# Patient Record
Sex: Female | Born: 1960 | Race: White | Hispanic: No | Smoking: Current every day smoker
Health system: Southern US, Community
[De-identification: ages and names within clinical notes are randomized; demographics above are authoritative.]

## PROBLEM LIST (undated history)

## (undated) DIAGNOSIS — Z720 Tobacco use: Secondary | ICD-10-CM

## (undated) DIAGNOSIS — M79603 Pain in arm, unspecified: Secondary | ICD-10-CM

## (undated) DIAGNOSIS — G8929 Other chronic pain: Secondary | ICD-10-CM

## (undated) DIAGNOSIS — M8580 Other specified disorders of bone density and structure, unspecified site: Secondary | ICD-10-CM

## (undated) HISTORY — PX: TONSILLECTOMY: SUR1361

## (undated) HISTORY — PX: BREAST REDUCTION SURGERY: SHX8

## (undated) HISTORY — PX: NECK SURGERY: SHX720

## (undated) HISTORY — PX: ABDOMINAL HYSTERECTOMY: SHX81

## (undated) HISTORY — DX: Tobacco use: Z72.0

## (undated) HISTORY — PX: OTHER SURGICAL HISTORY: SHX169

## (undated) HISTORY — DX: Other specified disorders of bone density and structure, unspecified site: M85.80

---

## 2006-05-25 ENCOUNTER — Encounter: Admission: RE | Admit: 2006-05-25 | Discharge: 2006-05-25 | Payer: Self-pay | Admitting: Neurosurgery

## 2006-06-09 ENCOUNTER — Inpatient Hospital Stay (HOSPITAL_COMMUNITY): Admission: RE | Admit: 2006-06-09 | Discharge: 2006-06-10 | Payer: Self-pay | Admitting: Neurosurgery

## 2006-09-05 ENCOUNTER — Ambulatory Visit (HOSPITAL_COMMUNITY): Admission: RE | Admit: 2006-09-05 | Discharge: 2006-09-05 | Payer: Self-pay | Admitting: Neurosurgery

## 2007-01-02 ENCOUNTER — Inpatient Hospital Stay (HOSPITAL_COMMUNITY): Admission: RE | Admit: 2007-01-02 | Discharge: 2007-01-05 | Payer: Self-pay | Admitting: Neurosurgery

## 2007-06-18 ENCOUNTER — Encounter: Admission: RE | Admit: 2007-06-18 | Discharge: 2007-06-18 | Payer: Self-pay | Admitting: Neurosurgery

## 2007-10-03 ENCOUNTER — Inpatient Hospital Stay (HOSPITAL_COMMUNITY): Admission: RE | Admit: 2007-10-03 | Discharge: 2007-10-05 | Payer: Self-pay | Admitting: Neurosurgery

## 2008-03-06 IMAGING — CT CT L SPINE W/ CM
3 of 8 series · 15 of 33 positions shown, 18 images · IV contrast (omnipaque)
Comparison: none

CLINICAL DATA: Back pain and lower extremity pain. 
 LUMBAR MYELOGRAM:
TECHNIQUE: The low back was prepped and draped in a sterile fashion. Lidocaine was utilized for local anesthesia.  Under fluoroscopic guidance, a 22 gauge spinal needle was inserted into the CSF space at L3-4 via left paramedian approach.  20 cc Omnipaque 180 was administered.  No complications were encountered.
TECHNIQUE: Multidetector CT imaging of the lumbar spine was performed after intrathecal injection of contrast.  Multiplanar CT image reconstructions were also generated.

[Series 4: recon 3: l-spine helical · axial · 0.27mm/px · z∈[-100,+38]mm · 7 of 149 slices shown, 9 images]
[im 19/149  soft-tissue]
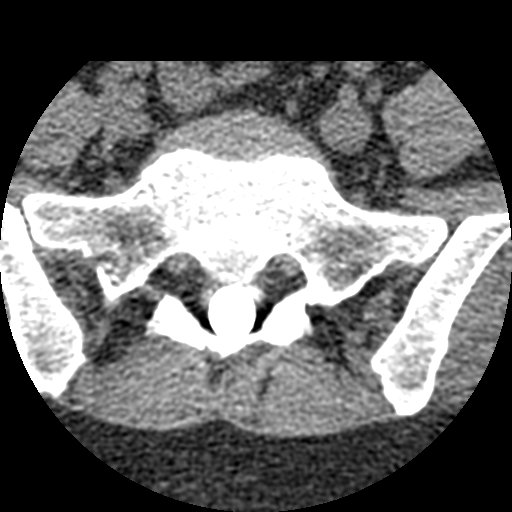
[im 19/149  bone]
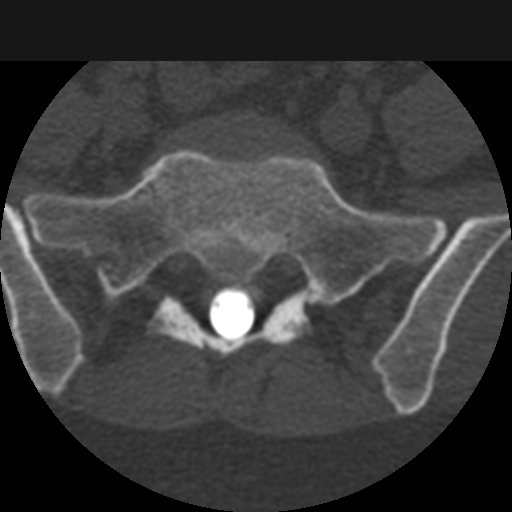
[im 38/149  bone]
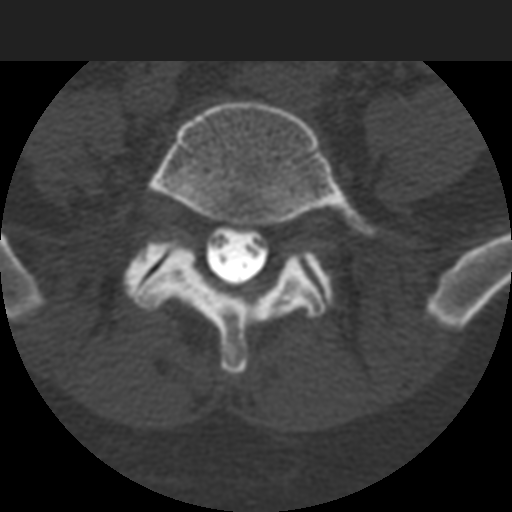
[im 56/149  bone]
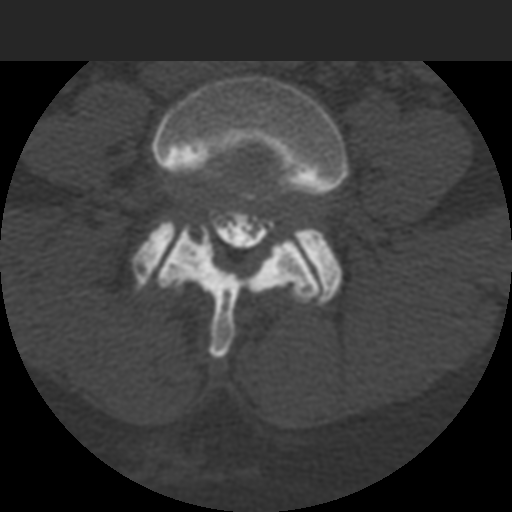
[im 75/149  bone]
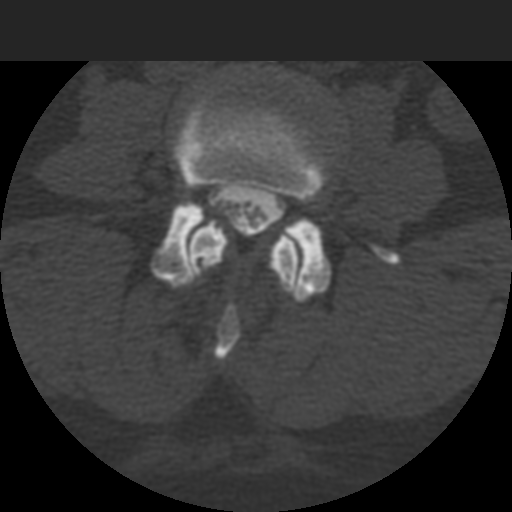
[im 93/149  soft-tissue]
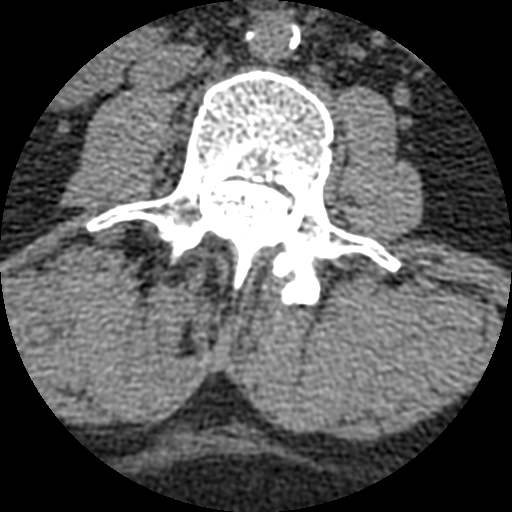
[im 93/149  bone]
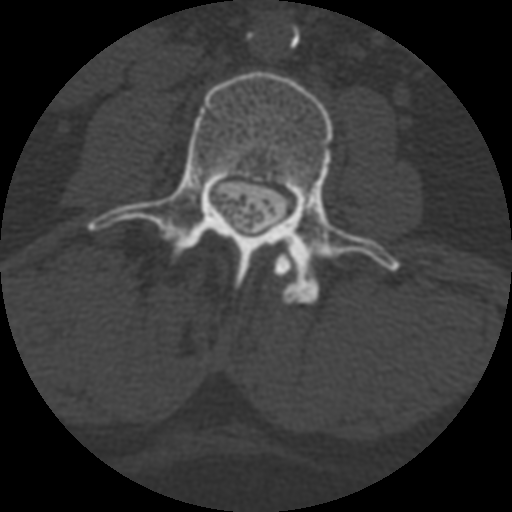
[im 112/149  bone]
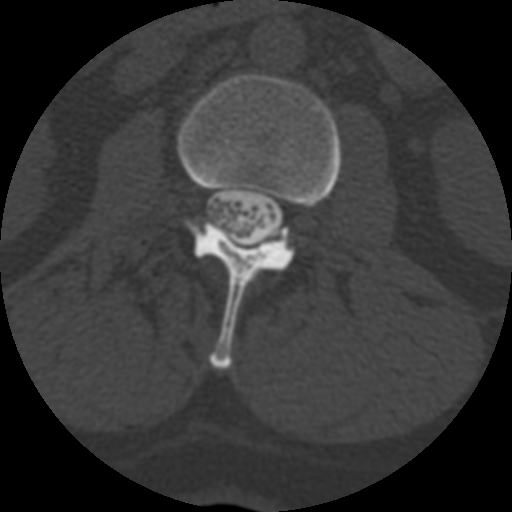
[im 130/149  bone]
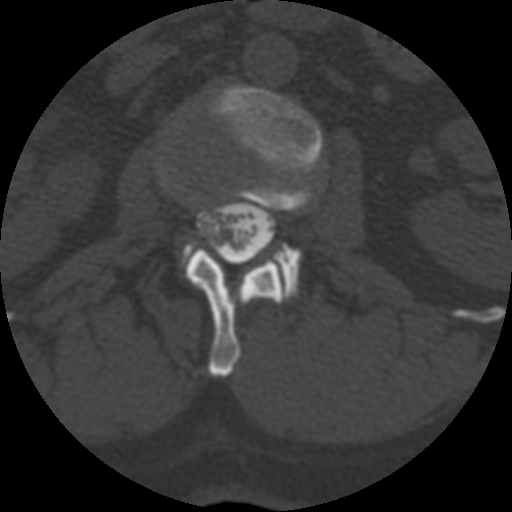

[Series 400: reformatted · sagittal · 0.37mm/px · 5 of 40 slices shown, 6 images (1 of 2)]
[im 14/40  bone]
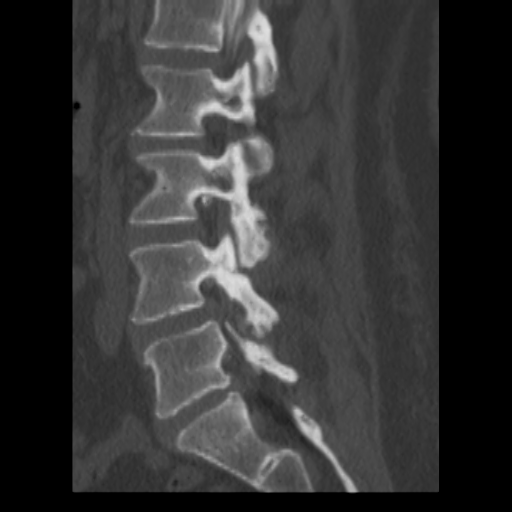
[im 17/40  bone]
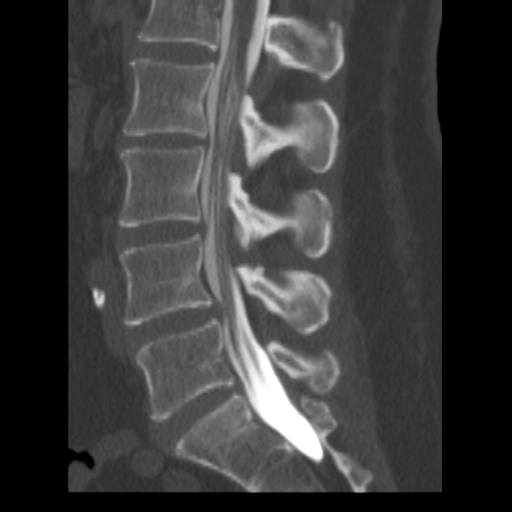
[im 20/40  soft-tissue]
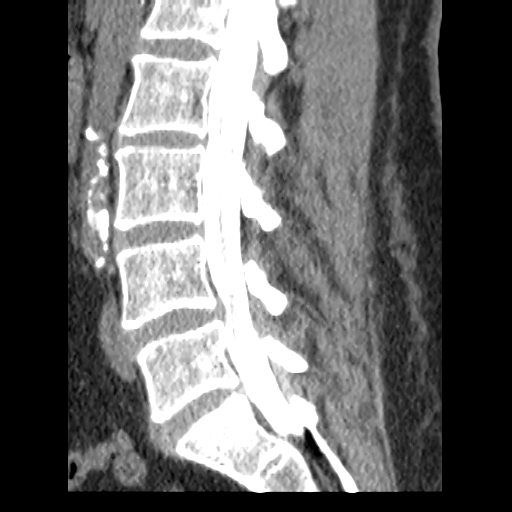
[im 20/40  bone]
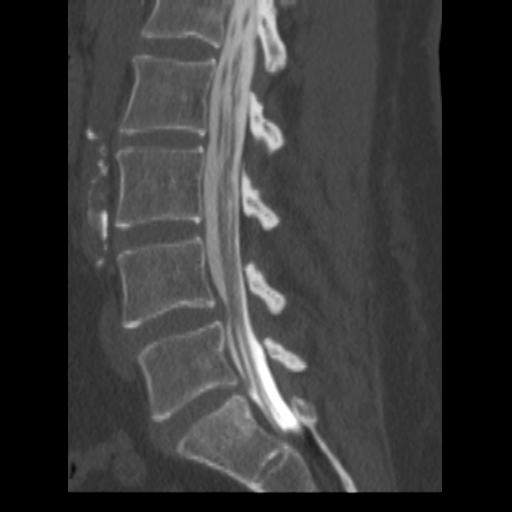
[im 23/40  bone]
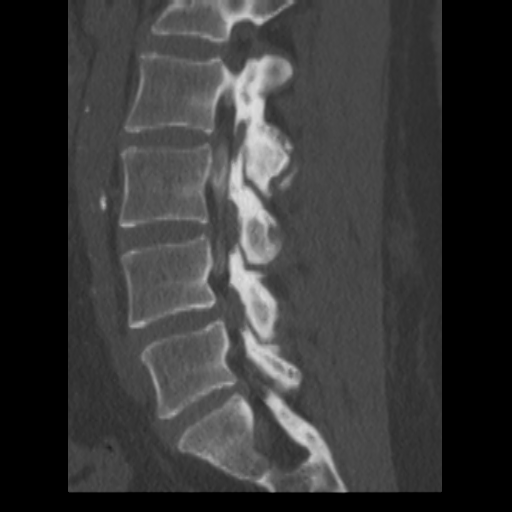
[im 27/40  bone]
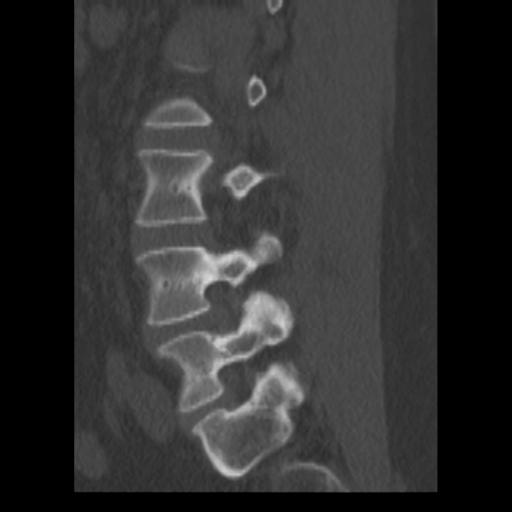

[Series 401: reformatted · coronal · 0.37mm/px · 3 of 40 slices shown (2 of 2)]
[im 8/40  bone]
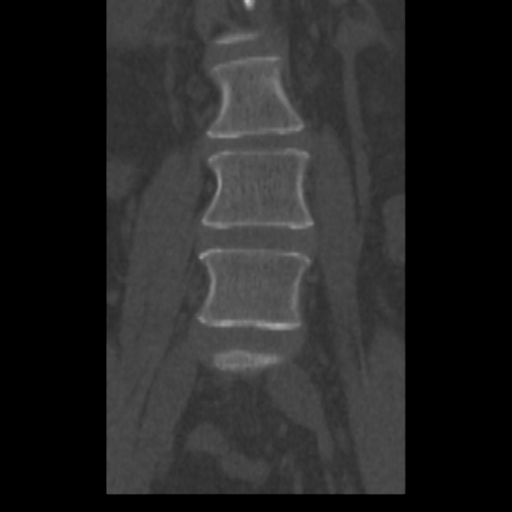
[im 16/40  bone]
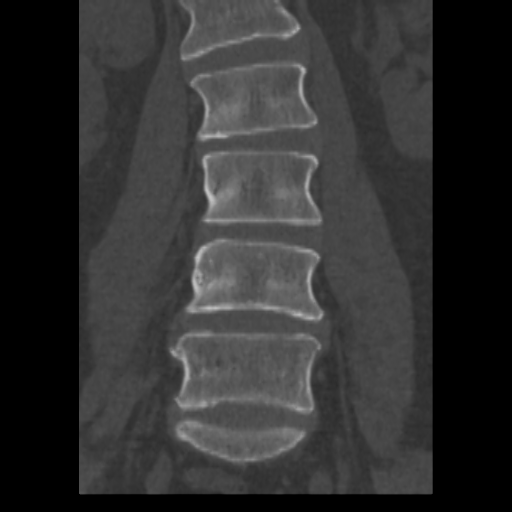
[im 24/40  bone]
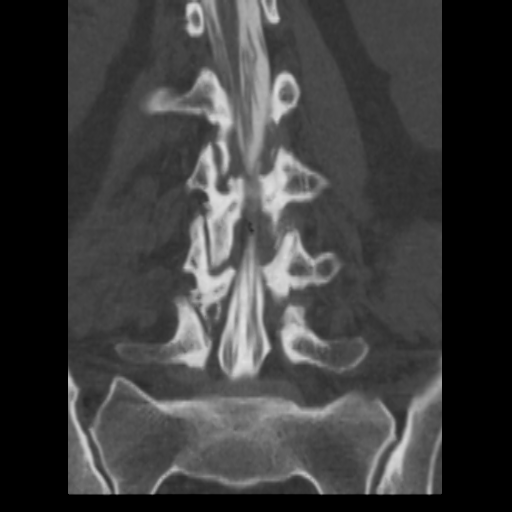

[15 of 33 positions shown; findings below may reference images not displayed]

FINDINGS: Marked dextroscoliosis of the mid thoracic spine is present.  Little if any curvature in the lumbar spine is present.  Moderate anterior epidural mass effect at L4-5 is present with an element of spinal stenosis.  There is some mass effect upon the L5 nerve roots, left greater than right.
IMPRESSION: Thoracic dextroscoliosis.  Spinal stenosis at L4-5.  CT to follow. 
 POST MYELOGRAM CT SCAN OF THE LUMBAR SPINE:
FINDINGS: The conus medullaris terminates at upper L2.  There is no vertebral body height loss.  Mild anterolisthesis at L4-5 is present.  
 L1-2:  Unremarkable.
 L2-3:  Unremarkable.
 L3-4:  Mild facet arthropathy and ligamentum flavum overgrowth, but no significant disk herniation or stenosis.
 L4-5:  Moderate concentric bulge asymmetric to the left causing moderate central stenosis and bilateral lateral recess stenosis, left greater than right.  Facet arthropathy and ligamentum flavum hypertrophy contributes.  Multifactorial moderate biforaminal narrowing is present, left greater than right.  
 L5-S1:  Mild concentric bulge without central stenosis.  Moderate right foraminal stenosis is present and multifactorial.  The left foramen is patent.
IMPRESSION: 1.  Moderate multifactorial central stenosis and bilateral lateral recess stenosis, left greater than right at L4-5. 
 2.  Multifactorial biforaminal stenosis at L4-5 and right foraminal stenosis at L5-S1.   The right L5-S1 foramen is most significantly affected. 
 3.  Multilevel facet arthropathy as described.

## 2008-03-06 IMAGING — RF DG MYELOGRAM LUMBAR
14 of 17 series · 14 of 17 positions shown · IV contrast (omnipaque)
Comparison: none

CLINICAL DATA: Back pain and lower extremity pain. 
 LUMBAR MYELOGRAM:
TECHNIQUE: The low back was prepped and draped in a sterile fashion. Lidocaine was utilized for local anesthesia.  Under fluoroscopic guidance, a 22 gauge spinal needle was inserted into the CSF space at L3-4 via left paramedian approach.  20 cc Omnipaque 180 was administered.  No complications were encountered.
TECHNIQUE: Multidetector CT imaging of the lumbar spine was performed after intrathecal injection of contrast.  Multiplanar CT image reconstructions were also generated.

[Series 1: (hospital) · 1 of 1 slices shown]
[im 1/1]
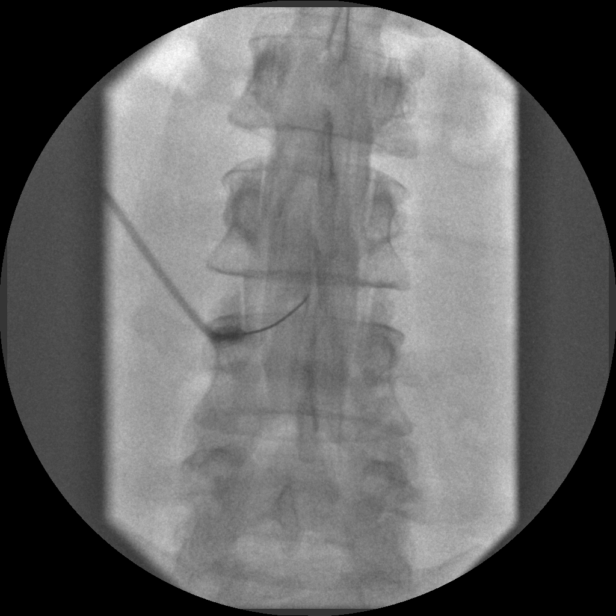

[Series 2: myelogram  white · 1 of 1 slices shown (1 of 13)]
[im 1/1]
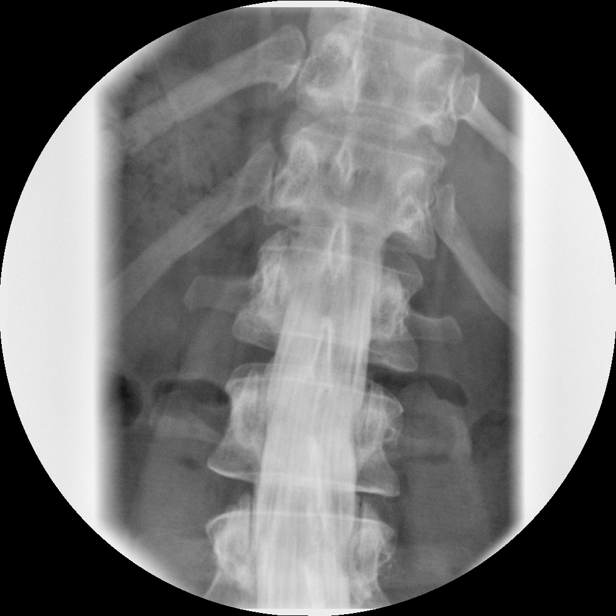

[Series 4: myelogram  white · 1 of 1 slices shown (2 of 13)]
[im 1/1]
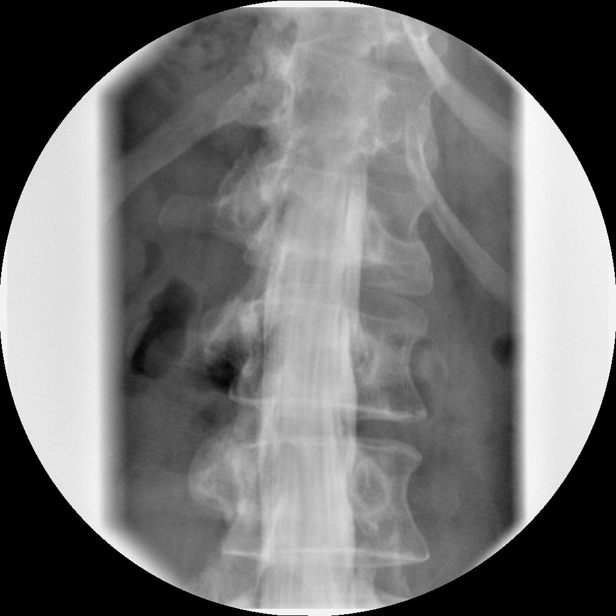

[Series 5: myelogram  white · 1 of 1 slices shown (3 of 13)]
[im 1/1]
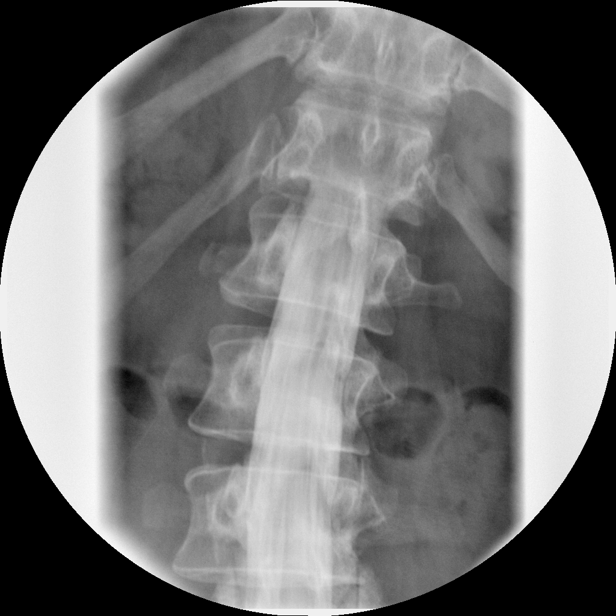

[Series 6: myelogram  white · 1 of 1 slices shown (4 of 13)]
[im 1/1]
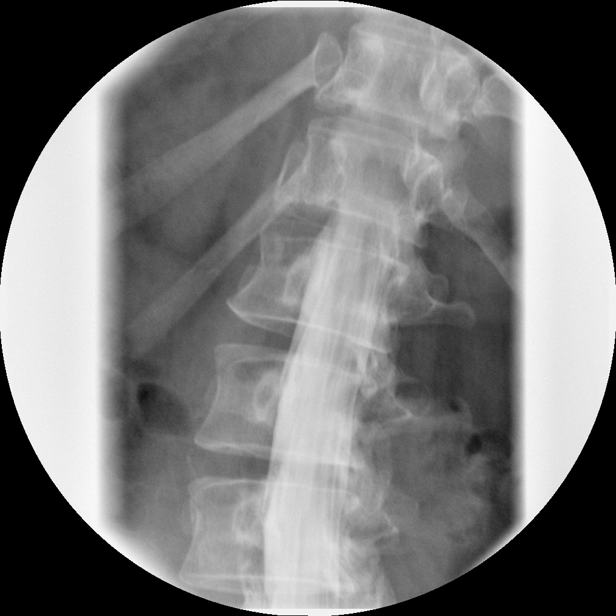

[Series 7: myelogram  white · 1 of 1 slices shown (5 of 13)]
[im 1/1]
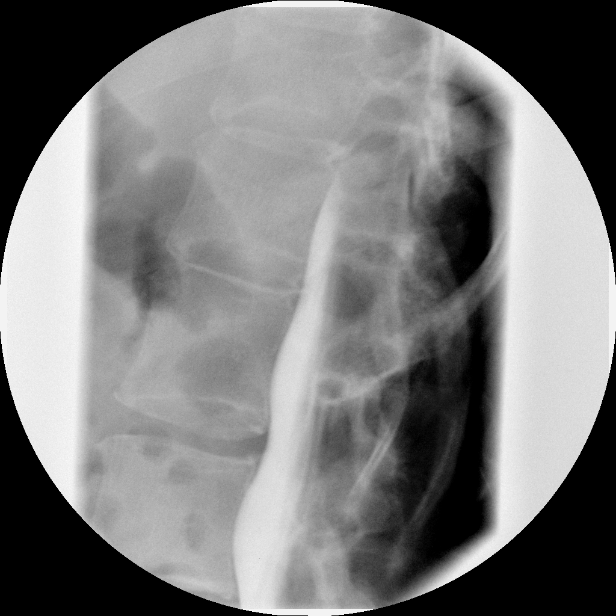

[Series 8: myelogram  white · 1 of 1 slices shown (6 of 13)]
[im 1/1]
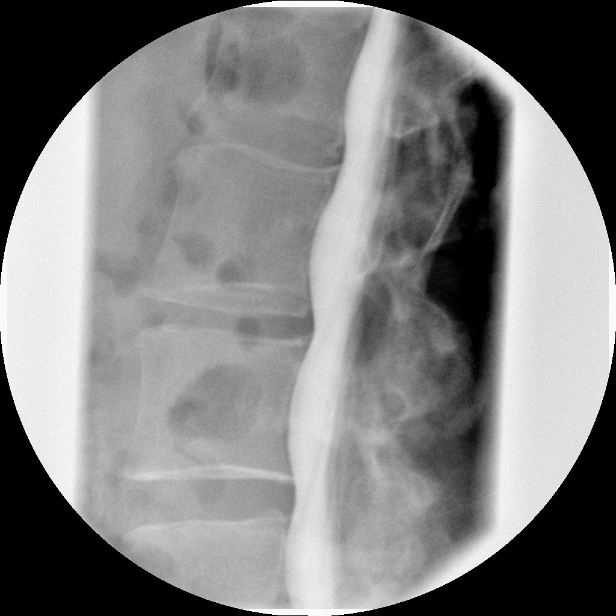

[Series 10: myelogram  white · 1 of 1 slices shown (7 of 13)]
[im 1/1]
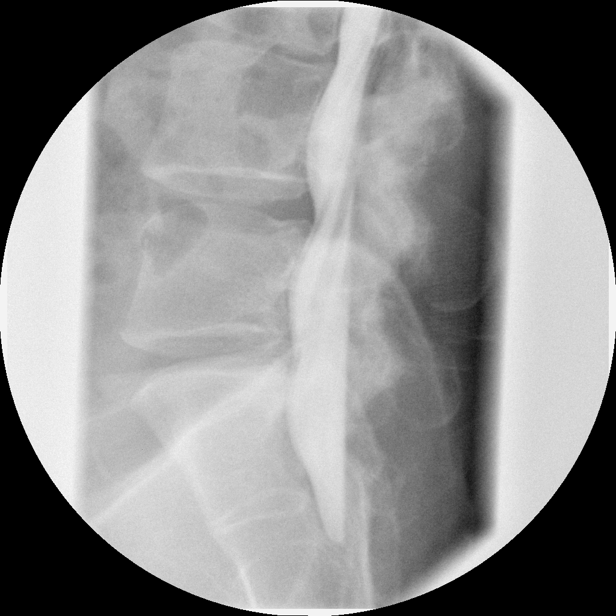

[Series 11: myelogram  white · 1 of 1 slices shown (8 of 13)]
[im 1/1]
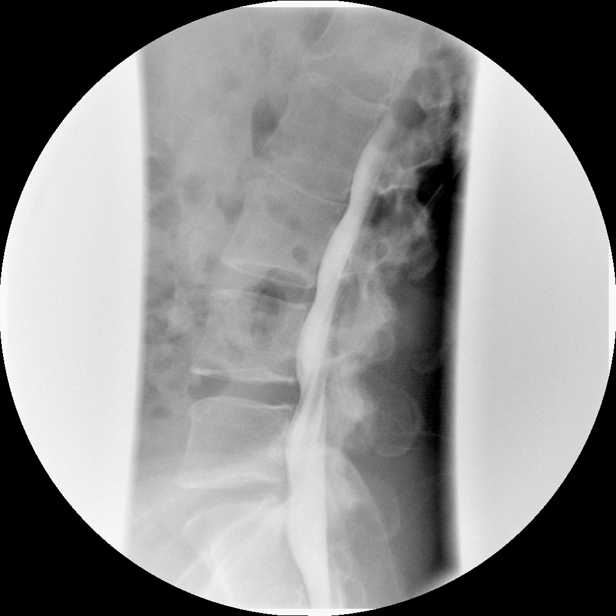

[Series 12: myelogram  white · 1 of 1 slices shown (9 of 13)]
[im 1/1]
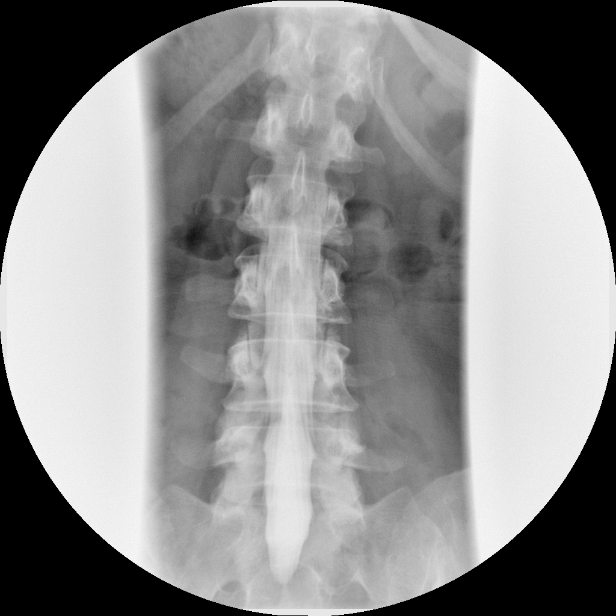

[Series 13: myelogram  white · 1 of 1 slices shown (10 of 13)]
[im 1/1]
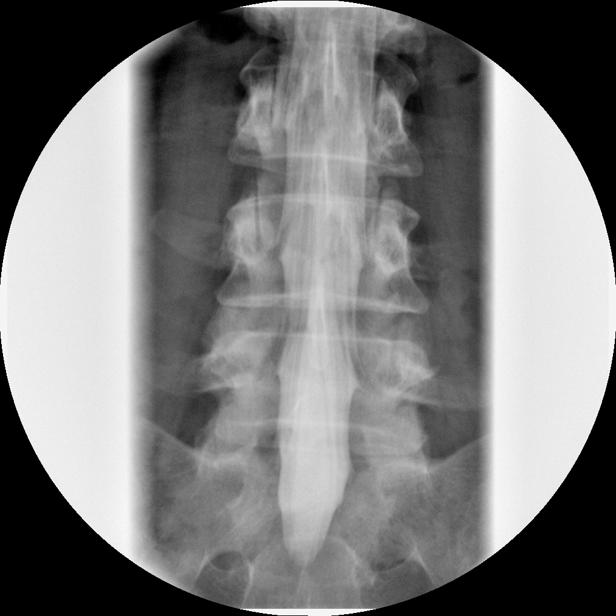

[Series 14: myelogram  white · 1 of 1 slices shown (11 of 13)]
[im 1/1]
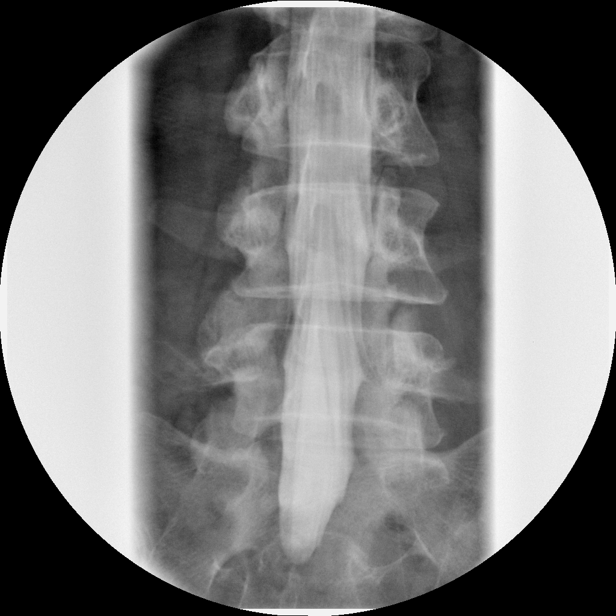

[Series 16: myelogram  white · 1 of 1 slices shown (12 of 13)]
[im 1/1]
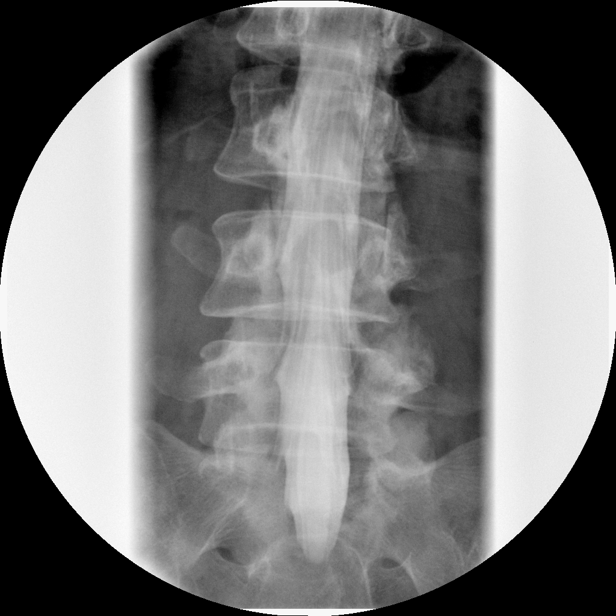

[Series 17: myelogram  white · 1 of 1 slices shown (13 of 13)]
[im 1/1]
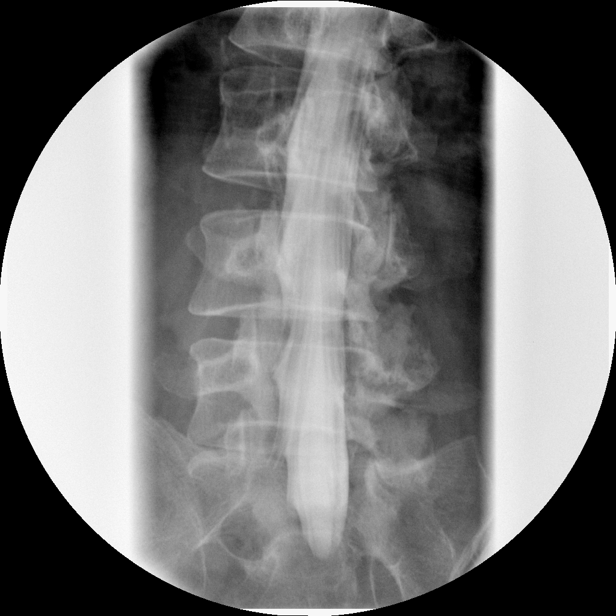

[14 of 17 positions shown; findings below may reference images not displayed]

FINDINGS: Marked dextroscoliosis of the mid thoracic spine is present.  Little if any curvature in the lumbar spine is present.  Moderate anterior epidural mass effect at L4-5 is present with an element of spinal stenosis.  There is some mass effect upon the L5 nerve roots, left greater than right.
IMPRESSION: Thoracic dextroscoliosis.  Spinal stenosis at L4-5.  CT to follow. 
 POST MYELOGRAM CT SCAN OF THE LUMBAR SPINE:
FINDINGS: The conus medullaris terminates at upper L2.  There is no vertebral body height loss.  Mild anterolisthesis at L4-5 is present.  
 L1-2:  Unremarkable.
 L2-3:  Unremarkable.
 L3-4:  Mild facet arthropathy and ligamentum flavum overgrowth, but no significant disk herniation or stenosis.
 L4-5:  Moderate concentric bulge asymmetric to the left causing moderate central stenosis and bilateral lateral recess stenosis, left greater than right.  Facet arthropathy and ligamentum flavum hypertrophy contributes.  Multifactorial moderate biforaminal narrowing is present, left greater than right.  
 L5-S1:  Mild concentric bulge without central stenosis.  Moderate right foraminal stenosis is present and multifactorial.  The left foramen is patent.
IMPRESSION: 1.  Moderate multifactorial central stenosis and bilateral lateral recess stenosis, left greater than right at L4-5. 
 2.  Multifactorial biforaminal stenosis at L4-5 and right foraminal stenosis at L5-S1.   The right L5-S1 foramen is most significantly affected. 
 3.  Multilevel facet arthropathy as described.

## 2010-08-05 ENCOUNTER — Encounter: Admission: RE | Admit: 2010-08-05 | Discharge: 2010-08-05 | Payer: Self-pay | Admitting: Neurosurgery

## 2010-11-20 ENCOUNTER — Encounter: Payer: Self-pay | Admitting: Neurosurgery

## 2010-11-21 ENCOUNTER — Encounter: Payer: Self-pay | Admitting: Neurosurgery

## 2010-11-22 ENCOUNTER — Encounter: Payer: Self-pay | Admitting: Neurosurgery

## 2011-02-23 ENCOUNTER — Encounter (HOSPITAL_COMMUNITY)
Admission: RE | Admit: 2011-02-23 | Discharge: 2011-02-23 | Disposition: A | Discharge status: Home / Self Care | Payer: Medicare Other | Source: Ambulatory Visit | Attending: Neurosurgery | Admitting: Neurosurgery

## 2011-02-23 LAB — CBC
HCT: 40.8 % (ref 36.0–46.0)
Hemoglobin: 13.7 g/dL (ref 12.0–15.0)
MCHC: 33.6 g/dL (ref 30.0–36.0)
MCV: 97.1 fL (ref 78.0–100.0)
Platelets: 301 10*3/uL (ref 150–400)
RBC: 4.2 MIL/uL (ref 3.87–5.11)
RDW: 13.3 % (ref 11.5–15.5)
WBC: 8.8 10*3/uL (ref 4.0–10.5)

## 2011-02-23 LAB — TYPE AND SCREEN
ABO/RH(D): A POS
Antibody Screen: NEGATIVE

## 2011-02-23 LAB — BASIC METABOLIC PANEL
BUN: 11 mg/dL (ref 6–23)
CO2: 27 mEq/L (ref 19–32)
Calcium: 9.2 mg/dL (ref 8.4–10.5)
Chloride: 106 mEq/L (ref 96–112)
Creatinine, Ser: 0.88 mg/dL (ref 0.4–1.2)
GFR calc Af Amer: >60 mL/min (ref >60)
GFR calc non Af Amer: >60 mL/min (ref >60)
Glucose, Bld: 86 mg/dL (ref 70–99)
Potassium: 4.6 mEq/L (ref 3.5–5.1)
Sodium: 137 mEq/L (ref 135–145)

## 2011-02-23 LAB — SURGICAL PCR SCREEN
MRSA, PCR: NEGATIVE
Staphylococcus aureus: POSITIVE — AB

## 2011-02-25 ENCOUNTER — Inpatient Hospital Stay (HOSPITAL_COMMUNITY): Payer: Medicare Other

## 2011-02-25 ENCOUNTER — Inpatient Hospital Stay (HOSPITAL_COMMUNITY)
Admission: RE | Admit: 2011-02-25 | Discharge: 2011-03-04 | DRG: 459 | Disposition: A | Discharge status: Home Health | Payer: Medicare Other | Source: Ambulatory Visit | Attending: Neurosurgery | Admitting: Neurosurgery

## 2011-02-25 DIAGNOSIS — F17200 Nicotine dependence, unspecified, uncomplicated: Secondary | ICD-10-CM | POA: Diagnosis present

## 2011-02-25 DIAGNOSIS — E669 Obesity, unspecified: Secondary | ICD-10-CM | POA: Diagnosis present

## 2011-02-25 DIAGNOSIS — J189 Pneumonia, unspecified organism: Secondary | ICD-10-CM | POA: Diagnosis not present

## 2011-02-25 DIAGNOSIS — E876 Hypokalemia: Secondary | ICD-10-CM | POA: Diagnosis not present

## 2011-02-25 DIAGNOSIS — R0902 Hypoxemia: Secondary | ICD-10-CM | POA: Diagnosis not present

## 2011-02-25 DIAGNOSIS — F0390 Unspecified dementia without behavioral disturbance: Secondary | ICD-10-CM | POA: Diagnosis present

## 2011-02-25 DIAGNOSIS — J449 Chronic obstructive pulmonary disease, unspecified: Secondary | ICD-10-CM | POA: Diagnosis present

## 2011-02-25 DIAGNOSIS — J4489 Other specified chronic obstructive pulmonary disease: Secondary | ICD-10-CM | POA: Diagnosis present

## 2011-02-25 DIAGNOSIS — Z01812 Encounter for preprocedural laboratory examination: Secondary | ICD-10-CM

## 2011-02-25 DIAGNOSIS — Z472 Encounter for removal of internal fixation device: Secondary | ICD-10-CM

## 2011-02-25 DIAGNOSIS — M899 Disorder of bone, unspecified: Secondary | ICD-10-CM | POA: Diagnosis present

## 2011-02-25 DIAGNOSIS — Z981 Arthrodesis status: Secondary | ICD-10-CM

## 2011-02-25 DIAGNOSIS — I959 Hypotension, unspecified: Secondary | ICD-10-CM | POA: Diagnosis not present

## 2011-02-25 DIAGNOSIS — Q762 Congenital spondylolisthesis: Principal | ICD-10-CM

## 2011-02-25 DIAGNOSIS — D649 Anemia, unspecified: Secondary | ICD-10-CM | POA: Diagnosis not present

## 2011-02-25 DIAGNOSIS — N318 Other neuromuscular dysfunction of bladder: Secondary | ICD-10-CM | POA: Diagnosis present

## 2011-02-25 DIAGNOSIS — Z79899 Other long term (current) drug therapy: Secondary | ICD-10-CM

## 2011-02-26 ENCOUNTER — Inpatient Hospital Stay (HOSPITAL_COMMUNITY): Payer: Medicare Other

## 2011-02-26 DIAGNOSIS — J0100 Acute maxillary sinusitis, unspecified: Secondary | ICD-10-CM

## 2011-02-26 DIAGNOSIS — J449 Chronic obstructive pulmonary disease, unspecified: Secondary | ICD-10-CM

## 2011-02-26 DIAGNOSIS — R0902 Hypoxemia: Secondary | ICD-10-CM

## 2011-02-26 LAB — CARDIAC PANEL(CRET KIN+CKTOT+MB+TROPI)
CK, MB: 13.1 ng/mL (ref 0.3–4.0)
Relative Index: 1.7 (ref 0.0–2.5)
Total CK: 1461 U/L — ABNORMAL HIGH (ref 7–177)

## 2011-02-26 LAB — BRAIN NATRIURETIC PEPTIDE: Pro B Natriuretic peptide (BNP): 111 pg/mL — ABNORMAL HIGH (ref 0.0–100.0)

## 2011-02-26 LAB — CBC
HCT: 31.3 % — ABNORMAL LOW (ref 36.0–46.0)
Hemoglobin: 10.2 g/dL — ABNORMAL LOW (ref 12.0–15.0)
MCH: 31.8 pg (ref 26.0–34.0)
MCV: 97.5 fL (ref 78.0–100.0)
RBC: 3.21 MIL/uL — ABNORMAL LOW (ref 3.87–5.11)

## 2011-02-26 LAB — BASIC METABOLIC PANEL
BUN: 7 mg/dL (ref 6–23)
CO2: 31 mEq/L (ref 19–32)
Chloride: 99 mEq/L (ref 96–112)
Creatinine, Ser: 0.7 mg/dL (ref 0.4–1.2)

## 2011-02-26 LAB — TSH: TSH: 0.508 u[IU]/mL (ref 0.350–4.500)

## 2011-02-27 ENCOUNTER — Inpatient Hospital Stay (HOSPITAL_COMMUNITY): Payer: Medicare Other

## 2011-02-27 DIAGNOSIS — J0100 Acute maxillary sinusitis, unspecified: Secondary | ICD-10-CM

## 2011-02-27 DIAGNOSIS — R0902 Hypoxemia: Secondary | ICD-10-CM

## 2011-02-27 DIAGNOSIS — J449 Chronic obstructive pulmonary disease, unspecified: Secondary | ICD-10-CM

## 2011-02-27 LAB — URINALYSIS, ROUTINE W REFLEX MICROSCOPIC
Bilirubin Urine: NEGATIVE
Glucose, UA: NEGATIVE mg/dL
Hgb urine dipstick: NEGATIVE
Protein, ur: NEGATIVE mg/dL
Specific Gravity, Urine: 1.013 (ref 1.005–1.030)
Urobilinogen, UA: 1 mg/dL (ref 0.0–1.0)

## 2011-02-27 LAB — BASIC METABOLIC PANEL
CO2: 33 mEq/L — ABNORMAL HIGH (ref 19–32)
Chloride: 99 mEq/L (ref 96–112)
Creatinine, Ser: 0.61 mg/dL (ref 0.4–1.2)
GFR calc Af Amer: >60 mL/min (ref >60)
Potassium: 3.4 mEq/L — ABNORMAL LOW (ref 3.5–5.1)
Sodium: 135 mEq/L (ref 135–145)

## 2011-02-27 LAB — CBC
Hemoglobin: 10.1 g/dL — ABNORMAL LOW (ref 12.0–15.0)
MCH: 32.2 pg (ref 26.0–34.0)
Platelets: 216 10*3/uL (ref 150–400)
RBC: 3.14 MIL/uL — ABNORMAL LOW (ref 3.87–5.11)
WBC: 11.8 10*3/uL — ABNORMAL HIGH (ref 4.0–10.5)

## 2011-02-27 LAB — PHOSPHORUS: Phosphorus: 2.9 mg/dL (ref 2.3–4.6)

## 2011-02-27 LAB — MAGNESIUM: Magnesium: 2 mg/dL (ref 1.5–2.5)

## 2011-02-27 LAB — URINE MICROSCOPIC-ADD ON

## 2011-02-28 ENCOUNTER — Inpatient Hospital Stay (HOSPITAL_COMMUNITY): Payer: Medicare Other

## 2011-02-28 DIAGNOSIS — J189 Pneumonia, unspecified organism: Secondary | ICD-10-CM

## 2011-02-28 LAB — BASIC METABOLIC PANEL
BUN: 3 mg/dL — ABNORMAL LOW (ref 6–23)
CO2: 31 mEq/L (ref 19–32)
Calcium: 8.6 mg/dL (ref 8.4–10.5)
Chloride: 96 mEq/L (ref 96–112)
Creatinine, Ser: 0.63 mg/dL (ref 0.4–1.2)
GFR calc Af Amer: >60 mL/min (ref >60)
GFR calc non Af Amer: >60 mL/min (ref >60)
GFR calc non Af Amer: >60 mL/min (ref >60)
Glucose, Bld: 98 mg/dL (ref 70–99)
Potassium: 2.6 mEq/L — CL (ref 3.5–5.1)
Sodium: 133 mEq/L — ABNORMAL LOW (ref 135–145)

## 2011-02-28 LAB — CBC
HCT: 30.2 % — ABNORMAL LOW (ref 36.0–46.0)
Hemoglobin: 9.4 g/dL — ABNORMAL LOW (ref 12.0–15.0)
Hemoglobin: 10 g/dL — ABNORMAL LOW (ref 12.0–15.0)
MCH: 32 pg (ref 26.0–34.0)
MCH: 32.2 pg (ref 26.0–34.0)
MCHC: 33.1 g/dL (ref 30.0–36.0)
MCV: 97.1 fL (ref 78.0–100.0)
Platelets: 224 10*3/uL (ref 150–400)
RBC: 2.94 MIL/uL — ABNORMAL LOW (ref 3.87–5.11)
RDW: 13.3 % (ref 11.5–15.5)
WBC: 11 10*3/uL — ABNORMAL HIGH (ref 4.0–10.5)

## 2011-02-28 LAB — URINE CULTURE
Culture  Setup Time: 201204291145
Special Requests: NEGATIVE

## 2011-02-28 LAB — MAGNESIUM: Magnesium: 1.9 mg/dL (ref 1.5–2.5)

## 2011-02-28 LAB — PHOSPHORUS: Phosphorus: 2.1 mg/dL — ABNORMAL LOW (ref 2.3–4.6)

## 2011-03-01 DIAGNOSIS — R0902 Hypoxemia: Secondary | ICD-10-CM

## 2011-03-01 DIAGNOSIS — J189 Pneumonia, unspecified organism: Secondary | ICD-10-CM

## 2011-03-01 DIAGNOSIS — J449 Chronic obstructive pulmonary disease, unspecified: Secondary | ICD-10-CM

## 2011-03-01 DIAGNOSIS — J0100 Acute maxillary sinusitis, unspecified: Secondary | ICD-10-CM

## 2011-03-01 LAB — VANCOMYCIN, TROUGH: Vancomycin Tr: 15.4 ug/mL (ref 10.0–20.0)

## 2011-03-01 LAB — LEGIONELLA ANTIGEN, URINE: Legionella Antigen, Urine: NEGATIVE

## 2011-03-02 ENCOUNTER — Inpatient Hospital Stay (HOSPITAL_COMMUNITY): Payer: Medicare Other

## 2011-03-02 LAB — BASIC METABOLIC PANEL
CO2: 30 mEq/L (ref 19–32)
Calcium: 8.4 mg/dL (ref 8.4–10.5)
GFR calc Af Amer: >60 mL/min (ref >60)
GFR calc non Af Amer: >60 mL/min (ref >60)
Potassium: 3 mEq/L — ABNORMAL LOW (ref 3.5–5.1)
Sodium: 138 mEq/L (ref 135–145)

## 2011-03-02 LAB — CBC
HCT: 27.8 % — ABNORMAL LOW (ref 36.0–46.0)
Hemoglobin: 9.1 g/dL — ABNORMAL LOW (ref 12.0–15.0)
MCHC: 32.7 g/dL (ref 30.0–36.0)
RBC: 2.87 MIL/uL — ABNORMAL LOW (ref 3.87–5.11)
WBC: 5.8 10*3/uL (ref 4.0–10.5)

## 2011-03-02 LAB — IRON AND TIBC
Saturation Ratios: 12 % — ABNORMAL LOW (ref 20–55)
TIBC: 153 ug/dL — ABNORMAL LOW (ref 250–470)
UIBC: 134 ug/dL

## 2011-03-02 LAB — VITAMIN B12: Vitamin B-12: 609 pg/mL (ref 211–911)

## 2011-03-03 LAB — BASIC METABOLIC PANEL
CO2: 28 mEq/L (ref 19–32)
Calcium: 8.7 mg/dL (ref 8.4–10.5)
Creatinine, Ser: 0.6 mg/dL (ref 0.4–1.2)
Glucose, Bld: 116 mg/dL — ABNORMAL HIGH (ref 70–99)

## 2011-03-03 LAB — CBC
HCT: 27.1 % — ABNORMAL LOW (ref 36.0–46.0)
Hemoglobin: 8.7 g/dL — ABNORMAL LOW (ref 12.0–15.0)
MCH: 31.2 pg (ref 26.0–34.0)
MCHC: 32.1 g/dL (ref 30.0–36.0)
RDW: 14.1 % (ref 11.5–15.5)

## 2011-03-04 ENCOUNTER — Inpatient Hospital Stay (HOSPITAL_COMMUNITY): Payer: Medicare Other

## 2011-03-05 LAB — CULTURE, BLOOD (ROUTINE X 2)
Culture  Setup Time: 201204291143
Culture: NO GROWTH

## 2011-03-06 NOTE — H&P (Signed)
  Christine Thornton, Christine Thornton                  ACCOUNT NO.:  [id number]  MEDICAL RECORD NO.:  [id number]           PATIENT TYPE:  LOCATION:                                 FACILITY:  PHYSICIAN:  Hilda Lias, M.D.   DATE OF BIRTH:  10/27/1961  DATE OF ADMISSION:  02/25/2011 DATE OF DISCHARGE:                             HISTORY & PHYSICAL   Ms. Gahm is a lady who in the past underwent fusion at the level of L4- L5.  The patient did well.  I followed her up in my office.  This was about 5 years ago.  Lately she had been complaining of more back pain worsened to both legs.  We had conservative treatment and at the end we did x-ray which showed grade 1 anterolisthesis at the level of L5-S1 with fracture of the pars interarticularis on the right side.  The fusion of L4-L5 solid.  Because of the findings, she is being admitted for fusion at the level of 5-1.  PAST MEDICAL HISTORY:  Breast reduction, L4-L5 fusion.  SOCIAL HISTORY:  The patient does not drink.  She smokes.  FAMILY HISTORY:  Unremarkable.  REVIEW OF SYSTEMS:  Positive for back pain, asthma, shortness of breath.  PHYSICAL EXAMINATION:  GENERAL:  The patient came to my office as she was quite miserable. HEAD, EARS, NOSE AND THROAT:  Normal. NECK:  Normal. LUNGS:  Rhonchi bilaterally. HEART:  Heart sounds normal. ABDOMEN:  Normal. EXTREMITIES:  Normal pulses. NEURO:  Weakness of plantar flexion 4/5.  She has a decreased flexibility in the lumbar spine, decrease of the reflexes in the ankle.  The x-rays show solid fusion of L4-5.  At level of 5-1, she has a grade 1 anterolisthesis with fracture of the pars.  IMPRESSION:  L5-S1 spondylolisthesis.  RECOMMENDATIONS:  The patient will be admitted for surgery.  We are going to explore the L4-L5 fusion and proceed with fusion at the L5-1. She is going to have new set of pedicle screws with posterolateral arthrodesis.  She knows the risks such as infection, CSF leak,  worsening pain, paralysis, need for surgery.          ______________________________ Hilda Lias, M.D.     EB/MEDQ  D:  02/25/2011  T:  02/25/2011  Job:  [id number]  Electronically Signed by Hilda Lias M.D. on 03/06/2011 08:12:06 PM

## 2011-03-06 NOTE — Op Note (Signed)
Christine Thornton, Christine Thornton                  ACCOUNT NO.:  [id number]  MEDICAL RECORD NO.:  [id number]           PATIENT TYPE:  I  LOCATION:  3001                         FACILITY:  [facility]  PHYSICIAN:  Hilda Lias, M.D.   DATE OF BIRTH:  09-18-1961  DATE OF PROCEDURE:  02/25/2011 DATE OF DISCHARGE:                              OPERATIVE REPORT   PREOPERATIVE DIAGNOSIS:  L5-S1 spondylolisthesis with fracture of the L5 pars with status post L4-5 fusion.  POSTOPERATIVE DIAGNOSIS:  L5-S1 spondylolisthesis with fracture of the L5 pars with status post L4-5 fusion.  PROCEDURES:  Revision of the L4-L5 fusion, removal of the previous rod. Lysis of adhesions bilaterally, L5-S1 diskectomy with facetectomy, to be able to remove the total disk which is beyond what normally the diskectomy is.  Interbody fusion with cages 10 x 22 with autograft and Osteocel.  The insertion of new pedicle screws at the level of S1, posterolateral arthrodesis at L5-S1 with autograft and Osteocel. Insertion of the rod from L4 down to S1 bilaterally.  Cell Saver C-arm.  SURGEON:  Hilda Lias, MD  ASSISTANT:  Cristi Loron, MD  CLINICAL HISTORY:  Christine Thornton is a lady who in the past underwent fusion at the level of L4-5.  She did well.  Several years later, she had been complaining of back pain with radiation to both legs, worse on the right side.  X-rays showed solid fusion at the level of L4-5 with spondylolisthesis at L5-S1 with fracture of the L5 right pars defect. In view of no improvement, surgery was advised.  PROCEDURE IN DETAIL:  The patient was taken to the OR, and after intubation, the patient was positioned in prone manner and the skin was cleaned with DuraPrep.  Then, midline incision following the previous one was made.  Then, the incision was carried out all the way laterally until we were able to find the pedicle screw for L4-L5.  We went down and we were able to feel and see the transverse  process of L4-5.  From then on, we went ahead and removed the caps of L4-5 screws and we removed the rod.  We investigated the L4-5 fusion and was completely solid.  Then, we proceeded with facetectomy and diskectomy at L5-S1. The compression was beyond normally what we do to be able to decompress the L5-S1 space.  At the end, we had a facetectomy of L5 with total gross diskectomy.  Having done this, two cages of 10 x 22 with autograft and Osteocel were inserted at the level of L5-S1 and the rest of the space was filled up with the Osteocel and autograft.  Then using the C- arm in AP and in lateral view, we probed the pedicle of S1.  Once we were able to probe the pedicles a PAP was used.  New holes for the pedicle were done.  Prior to insertion of the new pedicle screws, we probed the holes in all 4 quadrants to make sure there was no any perforation of the cortex.  A new set of pedicle screws of 5.5 x 25 was inserted.  Prior  to removal of the rod, we were able to remove the cross link.  AP and lateral view did show good position of the pedicle screw at the level of S1.  Then, a new rod from L5-S1 was used and they were secured in place using 3 caps, first on the right side, then on the left side.  We went laterally and we removed the periosteum of L5-S1 area and the ala of the sacrum.  Then a mix of Osteocel and autograft was used for arthrodesis bilaterally.  At the end, we had good decompression.  We investigated the L5-S1 space and the foramen were completely normal with normal space.  From then on, the wound was closed with Vicryl and Steri-Strips.          ______________________________ Hilda Lias, M.D.     EB/MEDQ  D:  02/25/2011  T:  02/26/2011  Job:  [id number]  Electronically Signed by Hilda Lias M.D. on 03/06/2011 08:12:08 PM

## 2011-03-06 NOTE — Discharge Summary (Signed)
  NAMEQUINNETTA, Thornton                  ACCOUNT NO.:  [id number]  MEDICAL RECORD NO.:  [id number]           PATIENT TYPE:  I  LOCATION:  3015                         FACILITY:  [facility]  PHYSICIAN:  Hilda Lias, M.D.   DATE OF BIRTH:  [birth year]  DATE OF ADMISSION:  02/25/2011 DATE OF DISCHARGE:  03/04/2011                              DISCHARGE SUMMARY   ADMISSION DIAGNOSES: 1. L5-S1 spondylolisthesis with a previous fusion at L4-5. 2. Chronic obstructive pulmonary disease.  FINAL DIAGNOSES: 1. L5-S1 spondylolisthesis with a previous fusion at L4-5. 2. Chronic obstructive pulmonary disease. 3. Pneumonia.  CLINICAL HISTORY:  The patient was admitted because of back pain with radiation to both legs.  Five years ago, she had fusion at L4-5.  Now she is getting worse with pain down to both legs.  X-ray showed that she has fracture of the L5-S1 with spondylolisthesis.  Surgery was advised.  LABORATORY DATA:  Sodium is 137, potassium 4.6.  White cell 8.8.  COURSE IN THE HOSPITAL:  The patient was taken to surgery and L5-S1 fusion was done.  The patient did well, but later on, she developed quite a bit of difficulty breathing, had to be readmitted to the ICU. Pulmonology service was called.  They knew her really well.  X-ray was obtained and she had bad case of pneumonia.  IV antibiotic was given. The patient had quite a bit of therapy for her lungs.  Right now, the patient is stable.  She is walking, minimal back complaint, the wound is well healed and I was called by Dr. Delford Field from the Pulmonary service and she is ready to go home.  CONDITION ON DISCHARGE:  Improvement.  MEDICATIONS:  Percocet, Flexeril, and Neurontin.  From Dr. Shan Levans, she was given albuterol, moxifloxacin, as well as she is going to have oxygen.  RECOMMENDATIONS:  The patient is not to drive, not to bend.  She also is to be fully aware that she needs to stop smoking because all her problems are  secondary to the osteopenia, secondary to her COPD, secondary to heavy smoking.  DIET:  Regular.  FOLLOWUP:  She is going to see by Dr. Shan Levans next week and she will call my office to see me in 4 weeks or before.  Again, I advised [name] about the need of stop smoking.          ______________________________ Hilda Lias, M.D.     EB/MEDQ  D:  03/04/2011  T:  03/05/2011  Job:  [id number]  Electronically Signed by Hilda Lias M.D. on 03/06/2011 08:12:10 PM

## 2011-03-11 ENCOUNTER — Encounter: Payer: Self-pay | Admitting: Adult Health

## 2011-03-11 ENCOUNTER — Inpatient Hospital Stay: Payer: Medicare Other | Admitting: Adult Health

## 2011-03-14 ENCOUNTER — Inpatient Hospital Stay: Payer: Medicare Other | Admitting: Adult Health

## 2011-03-15 NOTE — Op Note (Signed)
Christine Thornton, ZEISER                  ACCOUNT NO.:  [id number]   MEDICAL RECORD NO.:  [id number]          PATIENT TYPE:  INP   LOCATION:  2899                         FACILITY:  [facility]   PHYSICIAN:  Hilda Lias, M.D.   DATE OF BIRTH:  04-27-1961   DATE OF PROCEDURE:  10/03/2007  DATE OF DISCHARGE:                               OPERATIVE REPORT   PREOPERATIVE DIAGNOSIS:  C5-6, C6-7 spondylosis with chronic  radiculopathy, degenerative disk disease.  Chronic radiculopathy.   POSTOPERATIVE DIAGNOSIS:  C5-6, C6-7 spondylosis with chronic  radiculopathy, degenerative disk disease.  Chronic radiculopathy.   PROCEDURE:  Anterior 5-6, 6-7 decompression of spinal cord, diskectomy,  bilateral foraminotomy, interbody fusion using allograft and a plate.  Microscope.   SURGEON:  Hilda Lias, M.D.   CLINICAL HISTORY:  The patient was admitted because of neck pain with  radiation to both upper extremities.  The patient has failed with  conservative treatment.  X-rays showed that she has spondylosis at the  level of 5-6, 6-7.  Surgery was advised.  The risks were explained to  her in the history and physical.   PROCEDURE:  The patient was taken to the OR and after intubation the  neck was cleaned with DuraPrep.  Drapes were applied.  Transverse  incision through the skin, platysma down to cervical spine was done.  X-  rays showed that indeed we were at the level of 5-6.  From then on the  anterior ligament at the level of 5-6 and 6-7 was opened.  Total gross  diskectomy was achieved.  We brought the microscope into the area with  the drill and using the 1, 2 and 3 mm Kerrison punch, foraminotomy was  accomplished.  The posterior ligament was opened and decompression of  the spinal cord as well as both foraminotomy was done.  The patient had  quite a bit of spondylosis especially to the left side.  At the level of  C6-C7, it was more difficult because the posterior ligament was thin,  and  quite adherent to dura mater.  Dissection was carried out and  decompression of the cord as well both C7 nerve root was achieved.  With  the drill we drilled both the endplate at both levels and two allograft  of 7 mm were inserted.  This was followed by a plate using six screws.  Lateral cervical spine showed good position of the graft and the plate,  we were __________  we had good hemostasis.  Having done this the area  was irrigated and the wound was closed with Vicryl and Steri-Strips.           ______________________________  Hilda Lias, M.D.     EB/MEDQ  D:  10/03/2007  T:  10/03/2007  Job:  [id number]

## 2011-03-15 NOTE — H&P (Signed)
NAMEJAYLANIE, Christine Thornton                  ACCOUNT NO.:  [id number]   MEDICAL RECORD NO.:  [id number]          PATIENT TYPE:  INP   LOCATION:  3172                         FACILITY:  [facility]   PHYSICIAN:  Hilda Lias, M.D.   DATE OF BIRTH:  Jul 10, 1961   DATE OF ADMISSION:  10/03/2007  DATE OF DISCHARGE:                              HISTORY & PHYSICAL   HISTORY OF PRESENT ILLNESS:  Ms. Criswell is a lady who had not been seen  in my office for more than a year, complaining at the beginning of back  pain with radiation to both legs and lately complaining of neck pain  with radiation to both upper extremities which is getting worse.  The  patient had conservative treatment including nerve root injection  without any improvement.  Because of the pain that is sharp, she wanted  to proceed with surgery.   PAST MEDICAL HISTORY:  She has had breast reduction.   ALLERGIES:  She is not allergic to any medication.   SOCIAL HISTORY:  She does not drink.  She smokes a pack of cigarettes a  day.   FAMILY HISTORY:  Unremarkable.   REVIEW OF SYSTEMS:  Positive for neck pain, arm pain, bronchitis, UTI  and anxiety.   PHYSICAL EXAMINATION:  HEENT:  Normal.  NECK:  She has limitation of neck movement secondary to pain.  She has  weakness of the biceps and triceps.  She has some numbness which  involves mostly the C6 nerve root.   The cervical spine x-ray as well as the MRI showed that she had  spondylosis at the level of 5-6 and 6-7.  C4-5 and 6-7 __________ .   CLINICAL IMPRESSION:  C5-6 and 6-7 spondylosis with chronic  radiculopathy.   RECOMMENDATIONS:  The patient is being admitted for surgery.  The  procedure will be a 2-level anterior cervical diskectomy and fusion at  the level of 5-6 and 6-7.  The patient knows of the risks such as  infection, CSF leak, worsening pain, paralysis, stroke, need for surgery  and no improvement whatsoever.           ______________________________  Hilda Lias, M.D.     EB/MEDQ  D:  10/03/2007  T:  10/03/2007  Job:  [id number]

## 2011-03-18 NOTE — H&P (Signed)
NAMECARESSE, SEDIVY                  ACCOUNT NO.:  [id number]   MEDICAL RECORD NO.:  [id number]          PATIENT TYPE:  INP   LOCATION:  NA                           FACILITY:  [facility]   PHYSICIAN:  Hilda Lias, M.D.   DATE OF BIRTH:  1961/01/13   DATE OF ADMISSION:  06/09/2006  DATE OF DISCHARGE:                                HISTORY & PHYSICAL   Ms. Napoli is a lady who came to my office complaining of back pain radiating  to both legs, right worse than the left, and this has been going on for a  year.  The patient has had conservative treatment including p.r.n.  injections and physical therapy with no improvement.  Because of that, we  went ahead and did a lumbar and, in view of the findings, she wanted to  proceed with surgery.   PAST MEDICAL HISTORY:  Breast reduction.   She smokes a pack a day.  She does not drink.   FAMILY HISTORY:  Unremarkable.   REVIEW OF SYSTEMS:  Positive for back pain, UTI, anxiety, and depression.   PHYSICAL EXAMINATION:  HEENT:  Normal.  NECK:  Normal.  LUNGS:  Clear.  HEART:  Regular rate and rhythm.  NEUROLOGICAL:  She has weakness in both lower extremities with dorsiflexion  weakness.  Reflexes are symmetric.  Sensation is normal.   The lumbar x-ray as well as the myelogram shows that she has severe stenosis  at the level of L4-L5 and L5-S1.   CLINICAL IMPRESSION:  Lumbar stenosis L4-L5 and L5-S1 with chronic  radiculopathy.  The patient is going to be taken to surgery for L4-L5  laminectomy and foraminotomy.  The patient is aware of the risks of  infection, CSF leak, worsening of pain, no improvement whatsoever.  The  patient is fully aware that during surgery we will make a decision about if  she needs a posterolateral arthrodesis.           ______________________________  Hilda Lias, M.D.     EB/MEDQ  D:  06/09/2006  T:  06/09/2006  Job:  [id number]

## 2011-03-18 NOTE — H&P (Signed)
NAMECICILY, BONANO NO.:  [id number]   MEDICAL RECORD NO.:  [id number]          PATIENT TYPE:  INP   LOCATION:  2899                         FACILITY:  [facility]   PHYSICIAN:  Hilda Lias, M.D.   DATE OF BIRTH:  1961-04-11   DATE OF ADMISSION:  01/02/2007  DATE OF DISCHARGE:                              HISTORY & PHYSICAL   Mrs. Ensey is a lady who was seen by me initially a year ago complaining  of back pain radiating to both legs, right worse than the left one.  The  patient had had conservative treatment.  At that time we made the  diagnosis of lumbar spondylolisthesis with chronic back pain.  The  patient is getting worse and because of that she wants to proceed with  surgery.   PAST MEDICAL HISTORY:  The patient has __________.   ALLERGIES:  She is not allergic to any medication.   SOCIAL HISTORY:  Does not drink.  Smokes a pack a day.   FAMILY HISTORY:  Unremarkable.   REVIEW OF SYSTEMS:  Positive for headache, asthma, shortness of breath,  back pain, UTI, leg weakness.   PHYSICAL EXAMINATION:  HEENT:  Head was also normal.  NECK:  Normal.  LUNGS:  Clear.  HEART:  Heart sounds normal.  ABDOMEN:  Normal.  EXTREMITIES:  Normal pulses.  NEUROLOGIC:  Showed that she has had decrease of flexibility of the  lumbar spine.  Straight leg raising, on the left which is positive about  45 degrees.  She has weakness of dorsiflexion of both legs.   The lumbar x-ray showed that she has degenerative disk disease with  facet arthropathy at the level of L4-5 with spondylolisthesis which gets  worse with flexion.   CLINICAL IMPRESSION:  Lumbar stenosis with L4-5 spondylolisthesis.   RECOMMENDATIONS:  The patient being admitted for surgical.  Procedure  will be L4-5 diskectomy, interbody fusion, pedicle screws,  posterolateral fusion.  The patient told about the risks of infection,  CSF leak, hematoma, damage to the vessel of the abdomen and no  improvement  whatsoever because of the chronicity of pain, need for  further surgery.          ______________________________  Hilda Lias, M.D.    EB/MEDQ  D:  01/02/2007  T:  01/02/2007  Job:  [id number]

## 2011-03-18 NOTE — Discharge Summary (Signed)
NAMESPIRIT, WERNLI                  ACCOUNT NO.:  [id number]   MEDICAL RECORD NO.:  [id number]          PATIENT TYPE:  INP   LOCATION:  3020                         FACILITY:  [facility]   PHYSICIAN:  Hilda Lias, M.D.   DATE OF BIRTH:  13-Jul-1961   DATE OF ADMISSION:  01/02/2007  DATE OF DISCHARGE:  01/05/2007                               DISCHARGE SUMMARY   ADMISSION DIAGNOSIS:  L4-L5 spondylolisthesis with a chronic  radiculopathy.   POSTOPERATIVE DIAGNOSIS:  L4-L5 spondylolisthesis with a chronic  radiculopathy.   CLINICAL HISTORY:  The patient was admitted because of back pain  radiating to both legs.  The patient previously had laminectomy at the  level of L4-5.  Surgery was advised.   Laboratories were normal.   HOSPITAL COURSE:  The patient was taken to surgery and a fusion of L4-5  was done with cage and pedicle screws.  Today, she is feeling much  better.  She is ambulating and she is ready to go home.   CONDITION ON DISCHARGE:  Improved.   MEDICATIONS:  Percocet and Neurontin.   DISCHARGE INSTRUCTIONS:  Diet regular, activity allowed to drive.  No  heavy lifting for at least 4 weeks.  She is to be seen by me in 4 weeks  or as needed.           ______________________________  Hilda Lias, M.D.     EB/MEDQ  D:  01/05/2007  T:  01/05/2007  Job:  [id number]

## 2011-03-18 NOTE — Op Note (Signed)
Christine Thornton, Christine Thornton                  ACCOUNT NO.:  [id number]   MEDICAL RECORD NO.:  [id number]          PATIENT TYPE:  INP   LOCATION:  3020                         FACILITY:  [facility]   PHYSICIAN:  Hilda Lias, M.D.   DATE OF BIRTH:  [birth year]   DATE OF PROCEDURE:  01/02/2007  DATE OF DISCHARGE:                               OPERATIVE REPORT   PREOPERATIVE DIAGNOSES:  L4-5 spondylolisthesis with a chronic L4-5  radiculopathy, status post L4-5 laminectomies.   POSTOPERATIVE DIAGNOSES:  L4-5 spondylolisthesis with a chronic L4-5  radiculopathy, status post L4-5 laminectomies.   PROCEDURES:  L4-5 diskectomy, bilateral L4 facetectomy, lysis of  adhesions to decompress the L5-S1 nerve roots, pedicle screws, L4, L5,  interbody fusion with cages, posterolateral arthrodesis with autograft  and bone morphogenic protein, Cell Saver, C-arm.   SURGEON:  Hilda Lias, MD.   ASSISTANT:  Clydene Fake, MD.   CLINICAL HISTORY:  The patient had been complaining of back pain with  radiation to both legs.  The pain gets worse when she walks and moves.  Previously, she had an L4-5 laminectomy at [facility].  X-rays  showed spondylolisthesis at the level of L4-5.  Surgery was advised in  view of the persistent pain, and the patient knew about the risks.   DESCRIPTION OF PROCEDURES:  The patient was taken to the operating room,  and after intubation, she was positioned in a prone manner.  The skin  was cleaned with DuraPrep.  A midline incision resecting the previous  scar was made through the skin and suprapubic tissue to the thick layer  of scar tissue.  We went laterally and we went straight down to the  junction between the facet and the transverse process.  There was no  lamina or spinous process of L4-L5.  The patient had a lot of scar  tissue, and after 30 minutes, we were able to do a decompression of the  thecal sac.  Then, we tried to enter the disk space, but it was full  of  scar, and we proceeded with an L4 facetectomy bilaterally.  From then  on, working our way from lateral to midline, we entered the disk space  and total gross diskectomy was achieved.  The endplates were removed.  Then, 2 cages of 22 x 12 were inserted.  The cages had BMP and  autograft.  Then, the rest of the disk was packed with BMP and  autograft.  Using the C-arm, first in the A/P view and later the lateral  view, we probed the pedicles of L4 and L5.  Taps were inserted, and then  we had 4 screws of 5.5 x 45.  They were connected with a rod and caps.  Then, a crosslink from right to left was used.  We went laterally and  drilled the periosteum of the transverse processes of 4 and 5, as well  as the lateral aspect of the facet.  A mix of BMP and autograft was used  for the fusion.  From then on, the area was irrigated.  Valsalva  maneuvering was negative.  The wound was closed with Vicryl and Steri-  Strips.          ______________________________  Hilda Lias, M.D.    EB/MEDQ  D:  01/02/2007  T:  01/02/2007  Job:  [id number]

## 2011-03-18 NOTE — Op Note (Signed)
[name], Christine Thornton                  ACCOUNT NO.:  [id number]   MEDICAL RECORD NO.:  [id number]          PATIENT TYPE:  INP   LOCATION:  3037                         FACILITY:  [facility]   PHYSICIAN:  Hilda Lias, M.D.   DATE OF BIRTH:  1961-02-08   DATE OF PROCEDURE:  06/09/2006  DATE OF DISCHARGE:                                 OPERATIVE REPORT   SURGEON:  Hilda Lias, M.D.   PREOPERATIVE DIAGNOSES:  Lumbar stenosis with neurogenic claudication,  bilateral radiculopathy, left worse than right one.   POSTOPERATIVE DIAGNOSES:  Lumbar stenosis with neurogenic claudication,  bilateral radiculopathy, left worse than right one.   PROCEDURE:  Bilateral L4-L5 laminectomy, decompression of the thecal sac,  foraminotomy to decompress the L4, L5 and S1 nerve roots bilaterally,  microscopy.   CLINICAL HISTORY:  The patient was admitted because of back pain radiating  into both legs.  This had been going on for several months, and it was  getting worse lately.  Outpatient myelogram showed stenosis at L4-5, and  pain bilaterally to 3-4.  The patient wanted to proceed with surgery because  the pain was getting worse.  The risks were explained to her.   DESCRIPTION OF PROCEDURE:  The patient was taken to the OR, and placed on  the operating room table and positioned in a prone manner.  The back was  prepared with DuraPrep.  A midline incision was made through the skin and  subcutaneous tissues down to the spinous process.  X-rays showed that,  indeed, we were at the level of L4-5.  Then, with the Stille rongeur, we  removed the spinous processes of L4 and L5, and the laminae bilaterally.  With the microscope, we found that the patient had quite a bit of thickening  of the yellow ligament, compromising the L4-5 space.  Removal with the 2-  and 3-mm Kerrison punches was accomplished.  At the end, we went laterally  and we decompressed the foramina for the nerve roots of L4, L5 and S1.  Having  a good decompression, we investigated the area of the disk.  The disk  at L4-5 was bulging, but there was no evidence of any ruptured disk.  The  one at L5-1 was flat. From there on, the area was irrigated.  Hemostasis was  done with electrocautery.  Fentanyl and Depo-Medrol were left in the  epidural space, and the wound was closed with Vicryl and Steri-Strips.           ______________________________  Hilda Lias, M.D.     EB/MEDQ  D:  06/09/2006  T:  06/10/2006  Job:  [id number]

## 2011-08-08 LAB — CBC
MCHC: 35.1
Platelets: 407 — ABNORMAL HIGH
RDW: 13.7

## 2014-01-15 ENCOUNTER — Emergency Department (HOSPITAL_COMMUNITY)
Admission: EM | Admit: 2014-01-15 | Discharge: 2014-01-16 | Disposition: A | Discharge status: Home / Self Care | Payer: Medicare Other | Attending: Emergency Medicine | Admitting: Emergency Medicine

## 2014-01-15 ENCOUNTER — Encounter (HOSPITAL_COMMUNITY): Payer: Self-pay | Admitting: Emergency Medicine

## 2014-01-15 DIAGNOSIS — G8929 Other chronic pain: Secondary | ICD-10-CM | POA: Insufficient documentation

## 2014-01-15 DIAGNOSIS — Z8739 Personal history of other diseases of the musculoskeletal system and connective tissue: Secondary | ICD-10-CM | POA: Insufficient documentation

## 2014-01-15 DIAGNOSIS — F1120 Opioid dependence, uncomplicated: Secondary | ICD-10-CM | POA: Insufficient documentation

## 2014-01-15 DIAGNOSIS — F17200 Nicotine dependence, unspecified, uncomplicated: Secondary | ICD-10-CM | POA: Insufficient documentation

## 2014-01-15 DIAGNOSIS — F1110 Opioid abuse, uncomplicated: Secondary | ICD-10-CM

## 2014-01-15 DIAGNOSIS — Z79899 Other long term (current) drug therapy: Secondary | ICD-10-CM | POA: Insufficient documentation

## 2014-01-15 HISTORY — DX: Other chronic pain: G89.29

## 2014-01-15 HISTORY — DX: Pain in arm, unspecified: M79.603

## 2014-01-15 LAB — CBC WITH DIFFERENTIAL/PLATELET
Basophils Absolute: 0.1 K/uL (ref 0.0–0.1)
Basophils Relative: 1 % (ref 0–1)
Eosinophils Absolute: 0.3 K/uL (ref 0.0–0.7)
Eosinophils Relative: 3 % (ref 0–5)
HCT: 42.5 % (ref 36.0–46.0)
Hemoglobin: 14.4 g/dL (ref 12.0–15.0)
Lymphocytes Relative: 27 % (ref 12–46)
Lymphs Abs: 2.8 K/uL (ref 0.7–4.0)
MCH: 31.9 pg (ref 26.0–34.0)
MCHC: 33.9 g/dL (ref 30.0–36.0)
MCV: 94 fL (ref 78.0–100.0)
Monocytes Absolute: 0.5 K/uL (ref 0.1–1.0)
Monocytes Relative: 5 % (ref 3–12)
Neutro Abs: 6.8 K/uL (ref 1.7–7.7)
Neutrophils Relative %: 65 % (ref 43–77)
Platelets: 220 K/uL (ref 150–400)
RBC: 4.52 MIL/uL (ref 3.87–5.11)
RDW: 14.2 % (ref 11.5–15.5)
WBC: 10.5 K/uL (ref 4.0–10.5)

## 2014-01-15 LAB — RAPID URINE DRUG SCREEN, HOSP PERFORMED
AMPHETAMINES: NOT DETECTED
BARBITURATES: NOT DETECTED
BENZODIAZEPINES: NOT DETECTED
Cocaine: NOT DETECTED
Opiates: NOT DETECTED
TETRAHYDROCANNABINOL: NOT DETECTED

## 2014-01-15 LAB — BASIC METABOLIC PANEL
BUN: 12 mg/dL (ref 6–23)
CALCIUM: 9.2 mg/dL (ref 8.4–10.5)
CO2: 26 mEq/L (ref 19–32)
Chloride: 99 mEq/L (ref 96–112)
Creatinine, Ser: 0.67 mg/dL (ref 0.50–1.10)
GFR calc Af Amer: >90 mL/min (ref >90)
GLUCOSE: 103 mg/dL — AB (ref 70–99)
Potassium: 3.2 mEq/L — ABNORMAL LOW (ref 3.7–5.3)
SODIUM: 139 meq/L (ref 137–147)

## 2014-01-15 LAB — ETHANOL: Alcohol, Ethyl (B): <11 mg/dL (ref 0–11)

## 2014-01-15 MED ORDER — ACETAMINOPHEN 325 MG PO TABS: 650.0000 mg | ORAL_TABLET | ORAL | Status: DC | PRN

## 2014-01-15 MED ORDER — FESOTERODINE FUMARATE ER 8 MG PO TB24
8.0000 mg | ORAL_TABLET | Freq: Every day | ORAL | Status: DC
  Administered 2014-01-16: 8 mg via ORAL
  Filled 2014-01-15: qty 1

## 2014-01-15 MED ORDER — DICYCLOMINE HCL 10 MG PO CAPS
20.0000 mg | ORAL_CAPSULE | Freq: Once | ORAL | Status: AC
  Administered 2014-01-15: 20 mg via ORAL
  Filled 2014-01-15 (×2): qty 2

## 2014-01-15 MED ORDER — PANTOPRAZOLE SODIUM 40 MG PO TBEC: 40.0000 mg | DELAYED_RELEASE_TABLET | Freq: Every day | ORAL | Status: DC

## 2014-01-15 MED ORDER — LORAZEPAM 1 MG PO TABS
1.0000 mg | ORAL_TABLET | Freq: Once | ORAL | Status: AC
  Administered 2014-01-15: 1 mg via ORAL
  Filled 2014-01-15: qty 1

## 2014-01-15 MED ORDER — ZOLPIDEM TARTRATE 5 MG PO TABS: 5.0000 mg | ORAL_TABLET | Freq: Every evening | ORAL | Status: DC | PRN

## 2014-01-15 MED ORDER — NICOTINE 21 MG/24HR TD PT24: 21.0000 mg | MEDICATED_PATCH | Freq: Every day | TRANSDERMAL | Status: DC | PRN

## 2014-01-15 MED ORDER — ALUM & MAG HYDROXIDE-SIMETH 200-200-20 MG/5ML PO SUSP: 30.0000 mL | ORAL | Status: DC | PRN

## 2014-01-15 MED ORDER — TRAZODONE HCL 50 MG PO TABS: 150.0000 mg | ORAL_TABLET | Freq: Every day | ORAL | Status: DC

## 2014-01-15 MED ORDER — PREGABALIN 50 MG PO CAPS
100.0000 mg | ORAL_CAPSULE | Freq: Three times a day (TID) | ORAL | Status: DC
  Administered 2014-01-16 (×2): 100 mg via ORAL
  Filled 2014-01-15 (×3): qty 2

## 2014-01-15 MED ORDER — ESTRADIOL 1 MG PO TABS
1.0000 mg | ORAL_TABLET | Freq: Every day | ORAL | Status: DC
  Administered 2014-01-16: 1 mg via ORAL
  Filled 2014-01-15: qty 1

## 2014-01-15 MED ORDER — ONDANSETRON 8 MG PO TBDP
8.0000 mg | ORAL_TABLET | Freq: Once | ORAL | Status: AC
  Administered 2014-01-15: 8 mg via ORAL
  Filled 2014-01-15: qty 1

## 2014-01-15 MED ORDER — ONDANSETRON HCL 4 MG PO TABS: 4.0000 mg | ORAL_TABLET | Freq: Three times a day (TID) | ORAL | Status: DC | PRN

## 2014-01-15 MED ORDER — IBUPROFEN 200 MG PO TABS: 400.0000 mg | ORAL_TABLET | Freq: Three times a day (TID) | ORAL | Status: DC | PRN

## 2014-01-15 MED ORDER — LORAZEPAM 1 MG PO TABS
1.0000 mg | ORAL_TABLET | Freq: Three times a day (TID) | ORAL | Status: DC | PRN
  Administered 2014-01-16: 1 mg via ORAL
  Filled 2014-01-15: qty 1

## 2014-01-15 MED ORDER — LEVOTHYROXINE SODIUM 150 MCG PO TABS
150.0000 ug | ORAL_TABLET | Freq: Every day | ORAL | Status: DC
  Administered 2014-01-16: 150 ug via ORAL
  Filled 2014-01-15 (×2): qty 1
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
## 2014-01-15 NOTE — ED Notes (Signed)
Pt's daughters:  Jule SerLeigh Locklear tel# [phone] ([phone]                          Hilari Liner tel# [phone]  NOTE:  Please notify above family for updates and or for any question about patient.

## 2014-01-15 NOTE — ED Notes (Signed)
Pt wants detox from opiates roxicodiene/oxycodone. Pt on day one, last pill was yesterday. Tried to do it by herself for a little, pt got sick, got delusional and went back to it.

## 2014-01-15 NOTE — ED Provider Notes (Signed)
CSN: [id number]     Arrival date & time 01/15/14  1521 History   First MD Initiated Contact with Patient 01/15/14 1919     Chief Complaint  Patient presents with  . Medical Clearance     HPI Pt was seen at 1950. Per pt, c/o gradual onset and persistence of constant opiate abuse for the past several years. Pt states she has been taking oxycodone "6 times a day" to tx the chronic pain in her LUE. Pt states she "wants to come off of it" and is requesting detox. States she started to detox at home last week, and after 5 days "I couldn't take it anymore." States she developed abd "cramping," nausea, diarrhea, generalized muscle aches, and "was delusional." States she started to take her oxycodone again, and took her LD yesterday. Endorses her withdrawal symptoms "are starting to come back" today. Denies SI, no HI, no hallucinations.    Past Medical History  Diagnosis Date  . Osteopenia   . Tobacco abuse   . Chronic arm pain     left   Past Surgical History  Procedure Laterality Date  . L4-l5 fusion    . Breast reduction surgery    . Neck surgery    . Tonsillectomy    . Abdominal hysterectomy      History  Substance Use Topics  . Smoking status: Current Every Day Smoker  . Smokeless tobacco: Not on file  . Alcohol Use: No    Review of Systems ROS: Statement: All systems negative except as marked or noted in the HPI; Constitutional: Negative for fever and chills. ; ; Eyes: Negative for eye pain, redness and discharge. ; ; ENMT: Negative for ear pain, hoarseness, nasal congestion, sinus pressure and sore throat. ; ; Cardiovascular: Negative for chest pain, palpitations, diaphoresis, dyspnea and peripheral edema. ; ; Respiratory: Negative for cough, wheezing and stridor. ; ; Gastrointestinal: +abd cramping, nausea. Negative for vomiting, diarrhea, abdominal pain, blood in stool, hematemesis, jaundice and rectal bleeding. . ; ; Genitourinary: Negative for dysuria, flank pain and hematuria.  ; ; Musculoskeletal: +chronic left arm pain. Negative for back pain and neck pain. Negative for swelling and trauma.; ; Skin: Negative for pruritus, rash, abrasions, blisters, bruising and skin lesion.; ; Neuro: Negative for headache, lightheadedness and neck stiffness. Negative for weakness, altered level of consciousness , altered mental status, extremity weakness, paresthesias, involuntary movement, seizure and syncope.;  Psych:  No SI, no SA, no HI, no hallucinations.     Allergies  Review of patient's allergies indicates no known allergies.  Home Medications   Current Outpatient Rx  Name  Route  Sig  Dispense  Refill  . estradiol (ESTRACE) 1 MG tablet   Oral   Take 1 mg by mouth daily.         Marland Kitchen levothyroxine (SYNTHROID, LEVOTHROID) 150 MCG tablet   Oral   Take 150 mcg by mouth daily.         Marland Kitchen LYRICA 100 MG capsule   Oral   Take 100 mg by mouth 3 (three) times daily.         Marland Kitchen NEXIUM 20 MG capsule   Oral   Take 20 mg by mouth daily.         . TOVIAZ 8 MG TB24 tablet   Oral   Take 8 mg by mouth daily.         . traZODone (DESYREL) 150 MG tablet   Oral   Take 150 mg by  mouth at bedtime.         . Oxycodone HCl 10 MG TABS   Oral   Take 10 mg by mouth 6 (six) times daily.           BP 192/91  Pulse 99  Temp(Src) 98.3 F (36.8 C) (Oral)  Resp 18  SpO2 98% Physical Exam 1955: Physical examination:  Nursing notes reviewed; Vital signs and O2 SAT reviewed;  Constitutional: Well developed, Well nourished, Well hydrated, In no acute distress; Head:  Normocephalic, atraumatic; Eyes: EOMI, PERRL, No scleral icterus; ENMT: Mouth and pharynx normal, Mucous membranes moist; Neck: Supple, Full range of motion, No lymphadenopathy; Cardiovascular: Regular rate and rhythm, No gallop; Respiratory: Breath sounds clear & equal bilaterally, No wheezes.  Speaking full sentences with ease, Normal respiratory effort/excursion; Chest: Nontender, Movement normal; Abdomen:  Soft, Nontender, Nondistended, Normal bowel sounds; Genitourinary: No CVA tenderness; Extremities: Pulses normal, No tenderness, No edema, No calf edema or asymmetry.; Neuro: AA&Ox3, Major CN grossly intact.  Speech clear. No gross focal motor or sensory deficits in extremities.; Skin: Color normal, Warm, Dry.; Psych:  Affect flat, poor eye contact.    ED Course  Procedures     EKG Interpretation None      MDM  MDM Reviewed: previous chart, nursing note and vitals Reviewed previous: labs Interpretation: labs     Results for orders placed during the hospital encounter of 01/15/14  URINE RAPID DRUG SCREEN (HOSP PERFORMED)      Result Value Ref Range   Opiates NONE DETECTED  NONE DETECTED   Cocaine NONE DETECTED  NONE DETECTED   Benzodiazepines NONE DETECTED  NONE DETECTED   Amphetamines NONE DETECTED  NONE DETECTED   Tetrahydrocannabinol NONE DETECTED  NONE DETECTED   Barbiturates NONE DETECTED  NONE DETECTED  ETHANOL      Result Value Ref Range   Alcohol, Ethyl (B) <11  0 - 11 mg/dL  BASIC METABOLIC PANEL      Result Value Ref Range   Sodium 139  137 - 147 mEq/L   Potassium 3.2 (*) 3.7 - 5.3 mEq/L   Chloride 99  96 - 112 mEq/L   CO2 26  19 - 32 mEq/L   Glucose, Bld 103 (*) 70 - 99 mg/dL   BUN 12  6 - 23 mg/dL   Creatinine, Ser 1.610.67  0.50 - 1.10 mg/dL   Calcium 9.2  8.4 - 09.610.5 mg/dL   GFR calc non Af Amer >90  >90 mL/min   GFR calc Af Amer >90  >90 mL/min  CBC WITH DIFFERENTIAL      Result Value Ref Range   WBC 10.5  4.0 - 10.5 K/uL   RBC 4.52  3.87 - 5.11 MIL/uL   Hemoglobin 14.4  12.0 - 15.0 g/dL   HCT 04.542.5  40.936.0 - 81.146.0 %   MCV 94.0  78.0 - 100.0 fL   MCH 31.9  26.0 - 34.0 pg   MCHC 33.9  30.0 - 36.0 g/dL   RDW 91.414.2  78.211.5 - 95.615.5 %   Platelets 220  150 - 400 K/uL   Neutrophils Relative % 65  43 - 77 %   Neutro Abs 6.8  1.7 - 7.7 K/uL   Lymphocytes Relative 27  12 - 46 %   Lymphs Abs 2.8  0.7 - 4.0 K/uL   Monocytes Relative 5  3 - 12 %   Monocytes Absolute  0.5  0.1 - 1.0 K/uL   Eosinophils Relative 3  0 -  5 %   Eosinophils Absolute 0.3  0.0 - 0.7 K/uL   Basophils Relative 1  0 - 1 %   Basophils Absolute 0.1  0.0 - 0.1 K/uL     2300:  Meds given for pt's c/o withdrawal symptoms. Pt continues stable. TTS eval pending. Will move to Psych ED, holding orders written.     Laray Anger, DO 01/15/14 2333

## 2014-01-16 DIAGNOSIS — F1110 Opioid abuse, uncomplicated: Secondary | ICD-10-CM | POA: Clinically undetermined

## 2014-01-16 DIAGNOSIS — F1910 Other psychoactive substance abuse, uncomplicated: Secondary | ICD-10-CM

## 2014-01-16 NOTE — ED Notes (Signed)
Patient alert. TTS in with patient.  Safety maintained, Q 15 checks continue.

## 2014-01-16 NOTE — Progress Notes (Signed)
Writer spoke to the mother's daughter Letta Pate(Leah Locklear [phone]) and she will be picking up the patient between 11:30am and 12:00pm.   Writer informed the nurse and the patient was given resources for discharge.

## 2014-01-16 NOTE — Consult Note (Signed)
Face to face evaluation and I agree with this note 

## 2014-01-16 NOTE — ED Provider Notes (Signed)
[facility] - may d/c in the AM if no other facilities available - TTS to keep us informed for possible AM dispo home  Vida RollerBrian D Lady Wisham, MD 01/16/14 [phone]

## 2014-01-16 NOTE — Progress Notes (Signed)
Writer informed Dr. Hyacinth MeekerMiller that I will be assessing the patient.

## 2014-01-16 NOTE — Discharge Instructions (Signed)
Finding Treatment for Alcohol and Drug Addiction It can be hard to find the right place to get professional treatment. Here are some important things to consider:  There are different types of treatment to choose from.  Some programs are live-in (residential) while others are not (outpatient). Sometimes a combination is offered.  No single type of program is right for everyone.  Most treatment programs involve a combination of education, counseling, and a 12-step, spiritually-based approach.  There are non-spiritually based programs (not 12-step).  Some treatment programs are government sponsored. They are geared for patients without private insurance.  Treatment programs can vary in many respects such as:  Cost and types of insurance accepted.  Types of on-site medical services offered.  Length of stay, setting, and size.  Overall philosophy of treatment. A person may need specialized treatment or have needs not addressed by all programs. For example, adolescents need treatment appropriate for their age. Other people have secondary disorders that must be managed as well. Secondary conditions can include mental illness, such as depression or diabetes. Often, a period of detoxification from alcohol or drugs is needed. This requires medical supervision and not all programs offer this. THINGS TO CONSIDER WHEN SELECTING A TREATMENT PROGRAM   Is the program certified by the appropriate government agency? Even private programs must be certified and employ certified professionals.  Does the program accept your insurance? If not, can a payment plan be set up?  Is the facility clean, organized, and well run? Do they allow you to speak with graduates who can share their treatment experience with you? Can you tour the facility? Can you meet with staff?  Does the program meet the full range of individual needs?  Does the treatment program address sexual orientation and physical disabilities?  Do they provide age, gender, and culturally appropriate treatment services?  Is treatment available in languages other than English?  Is long-term aftercare support or guidance encouraged and provided?  Is assessment of an individual's treatment plan ongoing to ensure it meets changing needs?  Does the program use strategies to encourage reluctant patients to remain in treatment long enough to increase the likelihood of success?  Does the program offer counseling (individual or group) and other behavioral therapies?  Does the program offer medicine as part of the treatment regimen, if needed?  Is there ongoing monitoring of possible relapse? Is there a defined relapse prevention program? Are services or referrals offered to family members to ensure they understand addiction and the recovery process? This would help them support the recovering individual.  Are 12-step meetings held at the center or is transport available for patients to attend outside meetings? In countries outside of the [place], Magazine features editorsee local directories for contact information for services in your area. Document Released: 09/15/2005 Document Revised: 01/09/2012 Document Reviewed: 03/27/2008 [facility] Patient Information 2014 [employer] [place].  Drug Abuse and Addiction in Sports There are many types of drugs that one may become addicted to including illegal drugs (marijuana, cocaine, amphetamines, hallucinogens, and narcotics), prescription drugs (hydrocodone, codeine, and alprazolam), and other chemicals such as alcohol or nicotine. Two types of addiction exist: physical and emotional. Physical addiction usually occurs after prolonged use of a drug. However, some drugs may only take a couple uses before addiction can occur. Physical addiction is marked by withdrawal symptoms, in which the person experiences negative symptoms such as sweat, anxiety, tremors, hallucinations, or cravings in the absence of using the drug.  Emotional dependence is the psychological desire  for the "high" that the drugs produce when taken. SYMPTOMS   Inattentiveness.  Negligence.  Forgetfulness.  Insomnia.  Mood swings. RISK INCREASES WITH:   Family history of addiction.  Personal history of addictive personality. Studies have shown that risktakers, which many athletes are, have a higher risk of addiction. PREVENTION The only adequate prevention of drug abuse is abstinence from drugs. TREATMENT  The first step in quitting substance abuse is recognizing the problem and realizing that one has the power to change. Quitting requires a plan and support from others. It is often necessary to seek medical assistance. Caregivers are available to offer counseling, and for certain cases, medicine to diminish the physical symptoms of withdrawal. Many organizations exist such as Alcoholics Anonymous, Narcotics Anonymous, or the [employer] on Alcoholism that offer support for individuals who have chosen to quit their habits. Document Released: 10/17/2005 Document Revised: 01/09/2012 Document Reviewed: 01/29/2009 [facility] Patient Information 2014 [place], [place].

## 2014-01-16 NOTE — Consult Note (Signed)
[facility] Consult   Reason for Consult:  Opiate abuse Referring Physician:  EDP  Christine Thornton is an 53 y.o. female. Total Time spent with patient: 45 minutes  Assessment: AXIS I:  Substance Abuse AXIS II:  Deferred AXIS III:   Past Medical History  Diagnosis Date  . Osteopenia   . Tobacco abuse   . Chronic arm pain     left   AXIS IV:  other psychosocial or environmental problems and problems related to social environment AXIS V:  61-70 mild symptoms  Plan:  No evidence of imminent risk to self or others at present.   Patient does not meet criteria for psychiatric inpatient admission. Supportive therapy provided about ongoing stressors. Discussed crisis plan, support from social network, calling 911, coming to the Emergency Department, and calling Suicide Hotline.  Subjective:   Christine Thornton is a 53 y.o. female patient.  HPI:  Patient states that she is here because she wants opiate detox.  Patient started detox at home 5 days ago.  Patient states that she is still experiencing withdrawal from opiates.  Patient denies suicidal/homicidal ideation, psychosis, and paranoia.    HPI Elements:   Location:  opiate withdrawal. Quality:  opiate abuse. Severity:  5 days. Timing:  5 days.  Review of Systems  Constitutional: Positive for chills. Negative for malaise/fatigue and diaphoresis.  Gastrointestinal: Negative for nausea, vomiting, diarrhea and constipation.  Musculoskeletal: Negative for back pain, falls, joint pain, myalgias and neck pain.  Neurological: Positive for headaches. Negative for dizziness, tremors and seizures.  Psychiatric/Behavioral: Positive for substance abuse (Pain pills). Negative for depression, suicidal ideas, hallucinations and memory loss. The patient does not have insomnia.     Denies family history of substance abuse and mental illness Past Psychiatric History: Past Medical History  Diagnosis Date  . Osteopenia   . Tobacco abuse    . Chronic arm pain     left    reports that she has been smoking.  She does not have any smokeless tobacco history on file. She reports that she does not drink alcohol. Her drug history is not on file. No family history on file. Family History Substance Abuse: Yes, Describe: Family Supports: Yes, List: (Daughter) Living Arrangements: Children Can pt return to current living arrangement?: Yes   Allergies:  No Known Allergies  ACT Assessment Complete:  Yes:    Educational Status    Risk to Self: Risk to self Suicidal Ideation: No Suicidal Intent: No Is patient at risk for suicide?: No Suicidal Plan?: No Access to Means: No What has been your use of drugs/alcohol within the last 12 months?: Opiates Previous Attempts/Gestures: No How many times?: 0 Other Self Harm Risks: NA Triggers for Past Attempts: Other (Comment) (NA) Intentional Self Injurious Behavior: None Family Suicide History: No Recent stressful life event(s): Job Loss;Financial Problems Persecutory voices/beliefs?: No Depression: Yes Depression Symptoms: Despondent;Fatigue;Guilt;Feeling worthless/self pity;Loss of interest in usual pleasures Substance abuse history and/or treatment for substance abuse?: Yes Suicide prevention information given to non-admitted patients: Not applicable  Risk to Others: Risk to Others Homicidal Ideation: No Thoughts of Harm to Others: No Current Homicidal Intent: No Current Homicidal Plan: No Access to Homicidal Means: No Identified Victim: NA History of harm to others?: No Assessment of Violence: None Noted Violent Behavior Description: Calm and cooperative Does patient have access to weapons?: No Criminal Charges Pending?: No Does patient have a court date: No  Abuse:    Prior Inpatient Therapy: Prior Inpatient  Therapy Prior Inpatient Therapy: Yes Prior Therapy Dates: 1987 Prior Therapy Facilty/Provider([facility] Reason for Treatment: Depression   Prior Outpatient Therapy:  Prior Outpatient Therapy Prior Outpatient Therapy: No Prior Therapy Dates: NA Prior Therapy Facilty/Provider(s): NA Reason for Treatment: NA  Additional Information: Additional Information 1:1 In Past 12 Months?: No CIRT Risk: No Elopement Risk: No Does patient have medical clearance?: Yes   Objective: Blood pressure 163/94, pulse 76, temperature 98 F (36.7 C), temperature source Oral, resp. rate 18, SpO2 98.00%.There is no height or weight on file to calculate BMI. Results for orders placed during the hospital encounter of 01/15/14 (from the past 72 hour(s))  ETHANOL     Status: None   Collection Time    01/15/14  8:11 PM      Result Value Ref Range   Alcohol, Christine (B) <11  0 - 11 mg/dL   Comment:            LOWEST DETECTABLE LIMIT FOR     SERUM ALCOHOL IS 11 mg/dL     FOR MEDICAL PURPOSES ONLY  BASIC METABOLIC PANEL     Status: Abnormal   Collection Time    01/15/14  8:11 PM      Result Value Ref Range   Sodium 139  137 - 147 mEq/L   Potassium 3.2 (*) 3.7 - 5.3 mEq/L   Chloride 99  96 - 112 mEq/L   CO2 26  19 - 32 mEq/L   Glucose, Bld 103 (*) 70 - 99 mg/dL   BUN 12  6 - 23 mg/dL   Creatinine, Ser 0.67  0.50 - 1.10 mg/dL   Calcium 9.2  8.4 - 10.5 mg/dL   GFR calc non Af Amer >90  >90 mL/min   GFR calc Af Amer >90  >90 mL/min   Comment: (NOTE)     The eGFR has been calculated using the CKD EPI equation.     This calculation has not been validated in all clinical situations.     eGFR's persistently <90 mL/min signify possible Chronic Kidney     Disease.  CBC WITH DIFFERENTIAL     Status: None   Collection Time    01/15/14  8:11 PM      Result Value Ref Range   WBC 10.5  4.0 - 10.5 K/uL   RBC 4.52  3.87 - 5.11 MIL/uL   Hemoglobin 14.4  12.0 - 15.0 g/dL   HCT 42.5  36.0 - 46.0 %   MCV 94.0  78.0 - 100.0 fL   MCH 31.9  26.0 - 34.0 pg   MCHC 33.9  30.0 - 36.0 g/dL   RDW 14.2  11.5 - 15.5 %   Platelets 220  150 - 400 K/uL   Neutrophils Relative % 65  43 - 77 %    Neutro Abs 6.8  1.7 - 7.7 K/uL   Lymphocytes Relative 27  12 - 46 %   Lymphs Abs 2.8  0.7 - 4.0 K/uL   Monocytes Relative 5  3 - 12 %   Monocytes Absolute 0.5  0.1 - 1.0 K/uL   Eosinophils Relative 3  0 - 5 %   Eosinophils Absolute 0.3  0.0 - 0.7 K/uL   Basophils Relative 1  0 - 1 %   Basophils Absolute 0.1  0.0 - 0.1 K/uL  URINE RAPID DRUG SCREEN (HOSP PERFORMED)     Status: None   Collection Time    01/15/14  8:24 PM  Result Value Ref Range   Opiates NONE DETECTED  NONE DETECTED   Cocaine NONE DETECTED  NONE DETECTED   Benzodiazepines NONE DETECTED  NONE DETECTED   Amphetamines NONE DETECTED  NONE DETECTED   Tetrahydrocannabinol NONE DETECTED  NONE DETECTED   Barbiturates NONE DETECTED  NONE DETECTED   Comment:            DRUG SCREEN FOR MEDICAL PURPOSES     ONLY.  IF CONFIRMATION IS NEEDED     FOR ANY PURPOSE, NOTIFY LAB     WITHIN 5 DAYS.                LOWEST DETECTABLE LIMITS     FOR URINE DRUG SCREEN     Drug Class       Cutoff (ng/mL)     Amphetamine      1000     Barbiturate      200     Benzodiazepine   161     Tricyclics       096     Opiates          300     Cocaine          300     THC              50   Labs are reviewed and are pertinent for UDS negative, ETOH negative.  Medications reviewed and no changes made  Current Facility-Administered Medications  Medication Dose Route Frequency Provider Last Rate Last Dose  . acetaminophen (TYLENOL) tablet 650 mg  650 mg Oral Q4H PRN Alfonzo Feller, DO      . alum & mag hydroxide-simeth (MAALOX/MYLANTA) 200-200-20 MG/5ML suspension 30 mL  30 mL Oral PRN Alfonzo Feller, DO      . estradiol (ESTRACE) tablet 1 mg  1 mg Oral Daily Alfonzo Feller, DO      . fesoterodine (TOVIAZ) tablet 8 mg  8 mg Oral Daily Alfonzo Feller, DO      . ibuprofen (ADVIL,MOTRIN) tablet 400 mg  400 mg Oral Q8H PRN Alfonzo Feller, DO      . levothyroxine (SYNTHROID, LEVOTHROID) tablet 150 mcg  150 mcg Oral QAC breakfast  Alfonzo Feller, DO   150 mcg at 01/16/14 0454  . LORazepam (ATIVAN) tablet 1 mg  1 mg Oral Q8H PRN Alfonzo Feller, DO   1 mg at 01/16/14 0840  . nicotine (NICODERM CQ - dosed in mg/24 hours) patch 21 mg  21 mg Transdermal Daily PRN Alfonzo Feller, DO      . ondansetron [facility]) tablet 4 mg  4 mg Oral Q8H PRN Alfonzo Feller, DO      . pantoprazole (PROTONIX) EC tablet 40 mg  40 mg Oral Daily Alfonzo Feller, DO      . pregabalin (LYRICA) capsule 100 mg  100 mg Oral TID Alfonzo Feller, DO   100 mg at 01/16/14 0345  . traZODone (DESYREL) tablet 150 mg  150 mg Oral QHS Alfonzo Feller, DO      . zolpidem [facility]) tablet 5 mg  5 mg Oral QHS PRN Alfonzo Feller, DO       Current Outpatient Prescriptions  Medication Sig Dispense Refill  . estradiol (ESTRACE) 1 MG tablet Take 1 mg by mouth daily.      Marland Kitchen levothyroxine (SYNTHROID, LEVOTHROID) 150 MCG tablet Take 150 mcg by mouth daily.      Marland Kitchen LYRICA  100 MG capsule Take 100 mg by mouth 3 (three) times daily.      Marland Kitchen NEXIUM 20 MG capsule Take 20 mg by mouth daily.      . TOVIAZ 8 MG TB24 tablet Take 8 mg by mouth daily.      . traZODone (DESYREL) 150 MG tablet Take 150 mg by mouth at bedtime.      . Oxycodone HCl 10 MG TABS Take 10 mg by mouth 6 (six) times daily.         Psychiatric Specialty Exam:     Blood pressure 163/94, pulse 76, temperature 98 F (36.7 C), temperature source Oral, resp. rate 18, SpO2 98.00%.There is no height or weight on file to calculate BMI.  General Appearance: Casual  Eye Contact::  Good  Speech:  Clear and Coherent and Normal Rate  Volume:  Normal  Mood:  Anxious and Irritable  Affect:  Congruent  Thought Process:  Circumstantial  Orientation:  Full (Time, Place, and Person)  Thought Content:  Rumination  Suicidal Thoughts:  No  Homicidal Thoughts:  No  Memory:  Immediate;   Good Recent;   Good Remote;   Good  Judgement:  Poor  Insight:  Lacking  Psychomotor Activity:  Normal   Concentration:  Fair  Recall:  Good  Fund of Knowledge:Good  Language: Good  Akathisia:  No  Handed:  Right  AIMS (if indicated):     Assets:  Communication Skills Desire for Improvement Housing Social Support  Sleep:      Musculoskeletal: Strength & Muscle Tone: within normal limits Gait & Station: normal Patient leans: N/A  Treatment Plan Summary: Outpatient resources   Disposition:  Discharge home.  Give patient resource information for outpatient information opiate rehab, AA, NA, and therapy.  Discharge Assessment     Demographic Factors:  Caucasian and Female  Total Time spent with patient: 15 minutes  Psychiatric Specialty Exam: Same as above  Musculoskeletal: Same as above  Mental Status Per Nursing Assessment::   On Admission:     Current Mental Status by Physician: Patient denies suicidal/homicidal, psychosis, and paranoia  Loss Factors: NA  Historical Factors: NA  Risk Reduction Factors:   Positive social support  Continued Clinical Symptoms:  Alcohol/Substance Abuse/Dependencies  Cognitive Features That Contribute To Risk:  Closed-mindedness    Suicide Risk:  Minimal: No identifiable suicidal ideation.  Patients presenting with no risk factors but with morbid ruminations; may be classified as minimal risk based on the severity of the depressive symptoms  Discharge Diagnoses: Same as above diagnoses   Plan Of Care/Follow-up recommendations:  Activity:  Resume usual activity Diet:  Resume usual diet  Is patient on multiple antipsychotic therapies at discharge:  Yes,   Do you recommend tapering to monotherapy for antipsychotics?  No   Has Patient had three or more failed trials of antipsychotic monotherapy by history:  No  Recommended Plan for Multiple Antipsychotic Therapies: NA   Rankin, Shuvon, FNP-BC 01/16/2014 10:38 AM

## 2014-01-16 NOTE — BH Assessment (Signed)
Writer informed Dr. Hyacinth MeekerMiller that the patient will be pending a psych disposition of withdrawal symptoms for possible discharge in the morning.   Patient was declined by [facility] due to having Medicaid and [employer].  Patient was declined by [facility] due to having insurance.    [facility] [place] but a referred can be faxed for possible discharged in the morning.    Writer [facility]

## 2014-01-16 NOTE — BH Assessment (Addendum)
Assessment Note  Christine Thornton is an 53 y.o. white female requesting detox from opiates roxicodiene/oxycodone.  Patient reports that she has been abusing oxycodone for the past 12 years.  Patient reports that her use of pain pills began when she severed a nerve in her arm and she had to take pain medication.  Patient reports that she takes 6 60mg  of oxycodone daily.  Patient denies using any other drugs or alcohol.  Patient reports that she tried to quit at home but the withdrawal symptoms were too strong.    Patient reports that she started to detox at home last week, and after 5 days "I couldn't take it anymore." .  Patient reports the following withdrawal symptoms abdomen "cramping," nausea, diarrhea, generalized muscle aches, and "was delusional." The patient was not able to tell me how or why she felt that she was, "delusional".  Patient reports that she took her last pill yesterday.    Patient reports increased feelings of depression and anxiety due to her inability to stop abusing opiates.   Patient denies any new stressors or changes at home. Patient reports that she has had insomnia 5 to 6 years and has had little sleep in the past 5 days.  Patient denies SI/HI and psychosis.  [facility]  Patient denies medication management or therapy for depression.  Patient denies physical, sexual or emotional abuse.     Axis I: Opiate Dependence and Depressive Disorder  Axis II: Deferred Axis III:  Past Medical History  Diagnosis Date  . Osteopenia   . Tobacco abuse   . Chronic arm pain     left   Axis IV: economic problems, occupational problems, other psychosocial or environmental problems, problems related to social environment, problems with access to health care services and problems with primary support group Axis V: 31-40 impairment in reality testing  Past Medical History:  Past Medical History  Diagnosis Date  .  Osteopenia   . Tobacco abuse   . Chronic arm pain     left    Past Surgical History  Procedure Laterality Date  . L4-l5 fusion    . Breast reduction surgery    . Neck surgery    . Tonsillectomy    . Abdominal hysterectomy      Family History: No family history on file.  Social History:  reports that she has been smoking.  She does not have any smokeless tobacco history on file. She reports that she does not drink alcohol. Her drug history is not on file.  Additional Social History:     CIWA: CIWA-Ar BP: 179/98 mmHg Pulse Rate: 81 COWS:    Allergies: No Known Allergies  Home Medications:  (Not in a hospital admission)  OB/GYN Status:  No LMP recorded. Patient has had a hysterectomy.  General Assessment Data Location of Assessment: [facility] ED Is this a Tele or Face-to-Face Assessment?: Face-to-Face Is this an Initial Assessment or a Re-assessment for this encounter?: Initial Assessment Living Arrangements: Children Can pt return to current living arrangement?: Yes Admission Status: Voluntary Is patient capable of signing voluntary admission?: Yes Transfer from: Acute Hospital Referral Source: Self/Family/Friend  Medical Screening Exam [facility] ONLY) Medical Exam completed: Yes  [facility] Living Arrangements: Children Name of Psychiatrist: NA Name of Therapist: NA  Education Status Is patient currently in school?: No Current Grade: NA Highest grade of school patient has completed: NA Name of  school: NA Contact person: NA  Risk to self Suicidal Ideation: No Suicidal Intent: No Is patient at risk for suicide?: No Suicidal Plan?: No Access to Means: No What has been your use of drugs/alcohol within the last 12 months?: Opiates Previous Attempts/Gestures: No How many times?: 0 Other Self Harm Risks: NA Triggers for Past Attempts: Other (Comment) (NA) Intentional Self Injurious Behavior: None Family Suicide History: No Recent stressful life  event(s): Job Loss;Financial Problems Persecutory voices/beliefs?: No Depression: Yes Depression Symptoms: Despondent;Fatigue;Guilt;Feeling worthless/self pity;Loss of interest in usual pleasures Substance abuse history and/or treatment for substance abuse?: Yes Suicide prevention information given to non-admitted patients: Not applicable  Risk to Others Homicidal Ideation: No Thoughts of Harm to Others: No Current Homicidal Intent: No Current Homicidal Plan: No Access to Homicidal Means: No Identified Victim: NA History of harm to others?: No Assessment of Violence: None Noted Violent Behavior Description: Calm and cooperative Does patient have access to weapons?: No Criminal Charges Pending?: No Does patient have a court date: No  Psychosis Hallucinations: None noted Delusions: None noted  Mental Status Report Appear/Hygiene: Disheveled Eye Contact: Fair Motor Activity: Freedom of movement Speech: Logical/coherent Level of Consciousness: Restless;Alert Mood: Depressed;Anxious;Suspicious Affect: Appropriate to circumstance Anxiety Level: Minimal Thought Processes: Coherent;Relevant Judgement: Unimpaired Orientation: Person;Place;Situation;Time Obsessive Compulsive Thoughts/Behaviors: None  Cognitive Functioning Concentration: Decreased Memory: Recent Intact;Remote Intact IQ: Average Insight: Fair Impulse Control: Poor Appetite: Fair Weight Loss: 0 Weight Gain: 0 Sleep: Decreased Total Hours of Sleep: 4 Vegetative Symptoms: Decreased grooming;Staying in bed  ADLScreening [facility] Assessment Services) Patient's cognitive ability adequate to safely complete daily activities?: Yes Patient able to express need for assistance with ADLs?: Yes Independently performs ADLs?: Yes (appropriate for developmental age)  Prior Inpatient Therapy Prior Inpatient Therapy: Yes Prior Therapy Dates: 1987 Prior Therapy Facilty/Provider(s): [facility] Reason for Treatment: Depression    Prior Outpatient Therapy Prior Outpatient Therapy: No Prior Therapy Dates: NA Prior Therapy Facilty/Provider(s): NA Reason for Treatment: NA  ADL Screening (condition at time of admission) Patient's cognitive ability adequate to safely complete daily activities?: Yes Patient able to express need for assistance with ADLs?: Yes Independently performs ADLs?: Yes (appropriate for developmental age)         Values / Beliefs Cultural Requests During Hospitalization: None Spiritual Requests During Hospitalization: None        Additional Information 1:1 In Past 12 Months?: No CIRT Risk: No Elopement Risk: No Does patient have medical clearance?: Yes     Disposition: Pending psych disposition of withdrawal symptoms for possible discharge in the morning.  Disposition Initial Assessment Completed for this Encounter: Yes Disposition of Patient: Other dispositions Other disposition(s): Other (Comment)  On Site Evaluation by:   Reviewed with Physician:    Phillip Heal LaVerne 01/16/2014 4:02 AM

## 2014-03-06 ENCOUNTER — Other Ambulatory Visit: Payer: Self-pay | Admitting: Neurosurgery

## 2014-03-06 DIAGNOSIS — M5136 Other intervertebral disc degeneration, lumbar region: Secondary | ICD-10-CM

## 2014-03-13 ENCOUNTER — Ambulatory Visit
Admission: RE | Admit: 2014-03-13 | Discharge: 2014-03-13 | Disposition: A | Discharge status: Home / Self Care | Payer: Medicare Other | Source: Ambulatory Visit | Attending: Neurosurgery | Admitting: Neurosurgery

## 2014-03-13 ENCOUNTER — Other Ambulatory Visit: Payer: Self-pay

## 2014-03-13 VITALS — BP 115/67 | HR 74 | Ht 63.0 in | Wt 160.0 lb

## 2014-03-13 DIAGNOSIS — M5136 Other intervertebral disc degeneration, lumbar region: Secondary | ICD-10-CM

## 2014-03-13 DIAGNOSIS — M51369 Other intervertebral disc degeneration, lumbar region without mention of lumbar back pain or lower extremity pain: Secondary | ICD-10-CM

## 2014-03-13 MED ORDER — IOHEXOL 300 MG/ML  SOLN
10.0000 mL | Freq: Once | INTRAMUSCULAR | Status: AC | PRN
  Administered 2014-03-13: 10 mL via INTRATHECAL

## 2014-03-13 MED ORDER — DIAZEPAM 5 MG PO TABS
10.0000 mg | ORAL_TABLET | Freq: Once | ORAL | Status: AC
  Administered 2014-03-13: 10 mg via ORAL

## 2014-03-13 NOTE — Discharge Instructions (Signed)

## 2015-07-07 ENCOUNTER — Other Ambulatory Visit: Payer: Self-pay | Admitting: Neurosurgery

## 2015-07-07 DIAGNOSIS — M5415 Radiculopathy, thoracolumbar region: Secondary | ICD-10-CM

## 2015-07-27 ENCOUNTER — Ambulatory Visit
Admission: RE | Admit: 2015-07-27 | Discharge: 2015-07-27 | Disposition: A | Discharge status: Home / Self Care | Payer: PRIVATE HEALTH INSURANCE | Source: Ambulatory Visit | Attending: Neurosurgery | Admitting: Neurosurgery

## 2015-07-27 DIAGNOSIS — M5415 Radiculopathy, thoracolumbar region: Secondary | ICD-10-CM

## 2015-07-27 MED ORDER — DIAZEPAM 5 MG PO TABS
10.0000 mg | ORAL_TABLET | Freq: Once | ORAL | Status: AC
  Administered 2015-07-27: 10 mg via ORAL

## 2015-07-27 MED ORDER — IOHEXOL 180 MG/ML  SOLN
15.0000 mL | Freq: Once | INTRAMUSCULAR | Status: DC | PRN
  Administered 2015-07-27: 15 mL via INTRATHECAL

## 2015-07-27 MED ORDER — MEPERIDINE HCL 100 MG/ML IJ SOLN
75.0000 mg | Freq: Once | INTRAMUSCULAR | Status: AC
  Administered 2015-07-27: 75 mg via INTRAMUSCULAR

## 2015-07-27 MED ORDER — ONDANSETRON HCL 4 MG/2ML IJ SOLN
4.0000 mg | Freq: Once | INTRAMUSCULAR | Status: AC
  Administered 2015-07-27: 4 mg via INTRAMUSCULAR

## 2015-07-27 NOTE — Discharge Instructions (Signed)

## 2015-08-19 ENCOUNTER — Other Ambulatory Visit: Payer: Self-pay | Admitting: Neurosurgery

## 2015-08-19 DIAGNOSIS — M5415 Radiculopathy, thoracolumbar region: Secondary | ICD-10-CM

## 2015-10-01 DEATH — deceased
